# Patient Record
Sex: Female | Born: 1965
Health system: Southern US, Community
[De-identification: ages and names within clinical notes are randomized; demographics above are authoritative.]

## PROBLEM LIST (undated history)

## (undated) DIAGNOSIS — B001 Herpesviral vesicular dermatitis: Secondary | ICD-10-CM

## (undated) DIAGNOSIS — T7840XA Allergy, unspecified, initial encounter: Secondary | ICD-10-CM

## (undated) DIAGNOSIS — Z5189 Encounter for other specified aftercare: Secondary | ICD-10-CM

## (undated) HISTORY — DX: Herpesviral vesicular dermatitis: B00.1

## (undated) HISTORY — PX: TUBAL LIGATION: SHX77

## (undated) HISTORY — PX: ABDOMINAL HYSTERECTOMY: SHX81

## (undated) HISTORY — PX: BREAST SURGERY: SHX581

## (undated) HISTORY — DX: Encounter for other specified aftercare: Z51.89

## (undated) HISTORY — DX: Allergy, unspecified, initial encounter: T78.40XA

## (undated) HISTORY — PX: COSMETIC SURGERY: SHX468

---
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
## 1998-11-23 ENCOUNTER — Ambulatory Visit (HOSPITAL_COMMUNITY): Admission: RE | Admit: 1998-11-23 | Discharge: 1998-11-23 | Payer: Self-pay | Admitting: Gastroenterology

## 1999-03-11 ENCOUNTER — Ambulatory Visit (HOSPITAL_COMMUNITY): Admission: RE | Admit: 1999-03-11 | Discharge: 1999-03-11 | Payer: Self-pay | Admitting: Gastroenterology

## 1999-03-14 ENCOUNTER — Emergency Department (HOSPITAL_COMMUNITY): Admission: EM | Admit: 1999-03-14 | Discharge: 1999-03-14 | Payer: Self-pay | Admitting: Emergency Medicine

## 1999-05-19 ENCOUNTER — Ambulatory Visit (HOSPITAL_COMMUNITY): Admission: RE | Admit: 1999-05-19 | Discharge: 1999-05-19 | Payer: Self-pay | Admitting: Gastroenterology

## 1999-05-19 ENCOUNTER — Encounter: Payer: Self-pay | Admitting: Gastroenterology

## 1999-05-25 ENCOUNTER — Ambulatory Visit (HOSPITAL_COMMUNITY): Admission: RE | Admit: 1999-05-25 | Discharge: 1999-05-25 | Payer: Self-pay | Admitting: Gastroenterology

## 1999-05-25 ENCOUNTER — Encounter: Payer: Self-pay | Admitting: Gastroenterology

## 1999-11-14 DIAGNOSIS — Z5189 Encounter for other specified aftercare: Secondary | ICD-10-CM

## 1999-11-14 HISTORY — DX: Encounter for other specified aftercare: Z51.89

## 2000-07-31 ENCOUNTER — Encounter (INDEPENDENT_AMBULATORY_CARE_PROVIDER_SITE_OTHER): Payer: Self-pay | Admitting: Specialist

## 2000-07-31 ENCOUNTER — Inpatient Hospital Stay (HOSPITAL_COMMUNITY): Admission: RE | Admit: 2000-07-31 | Discharge: 2000-08-04 | Payer: Self-pay | Admitting: Obstetrics and Gynecology

## 2009-10-06 ENCOUNTER — Emergency Department (HOSPITAL_COMMUNITY): Admission: EM | Admit: 2009-10-06 | Discharge: 2009-10-07 | Payer: Self-pay | Admitting: Emergency Medicine

## 2009-10-26 ENCOUNTER — Ambulatory Visit (HOSPITAL_COMMUNITY): Admission: RE | Admit: 2009-10-26 | Discharge: 2009-10-26 | Payer: Self-pay | Admitting: Gastroenterology

## 2010-12-05 IMAGING — US US ABDOMEN COMPLETE
1 series · 13 of 25 positions shown · non-contrast
Comparison: None

CLINICAL DATA: Abdominal pain.

ABDOMINAL ULTRASOUND COMPLETE

[Series 1: us abdomen complete · 0.32mm/px · 13 of 46 slices shown]
[im 1/46]
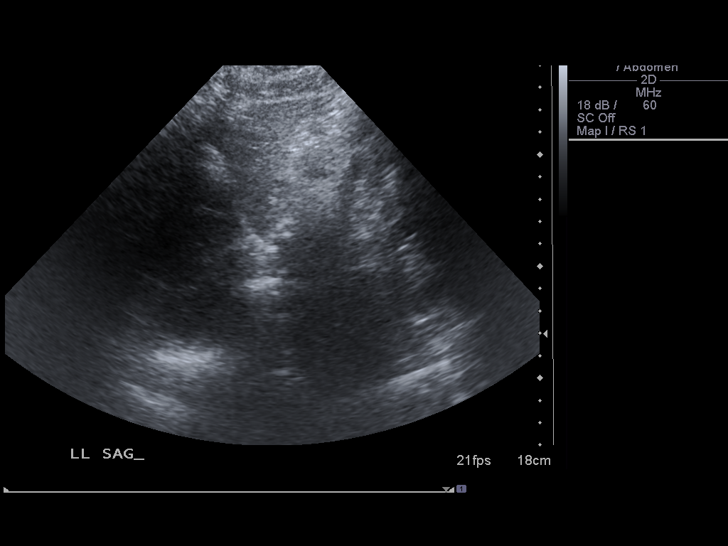
[im 4/46]
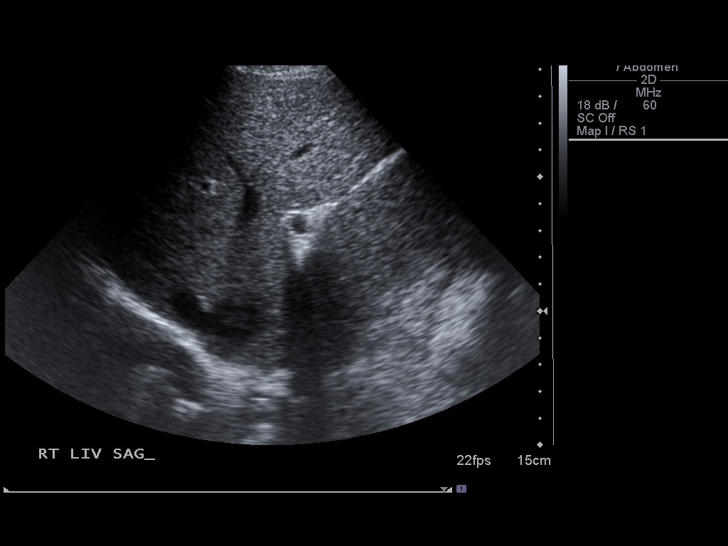
[im 8/46]
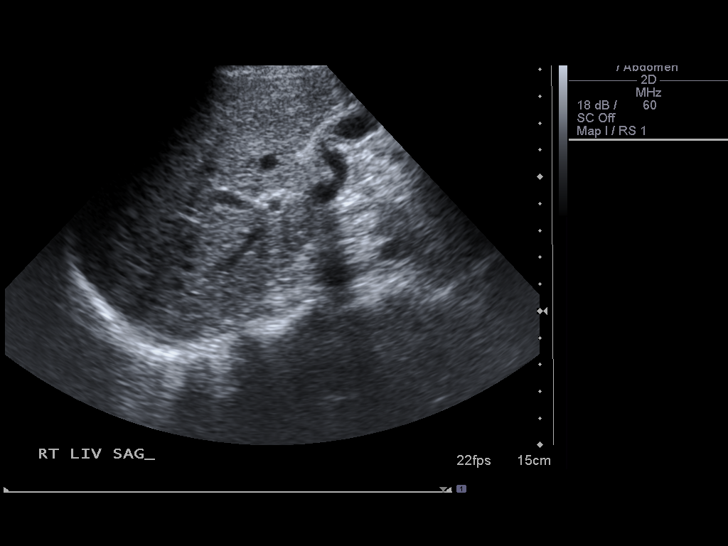
[im 12/46]
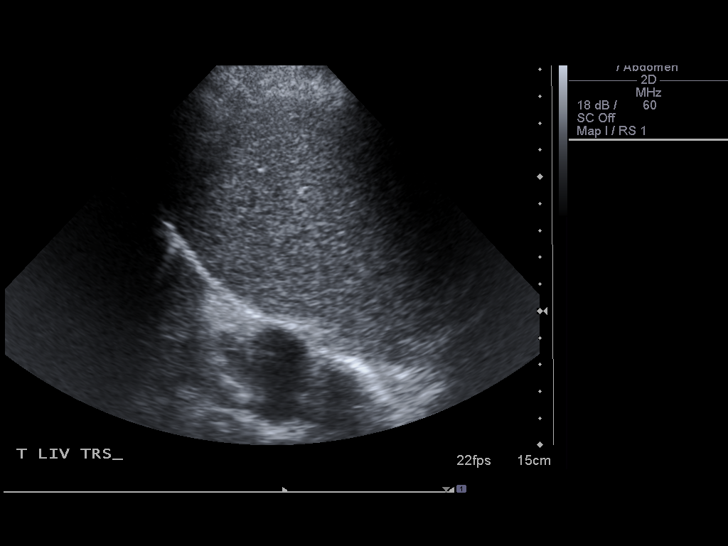
[im 16/46]
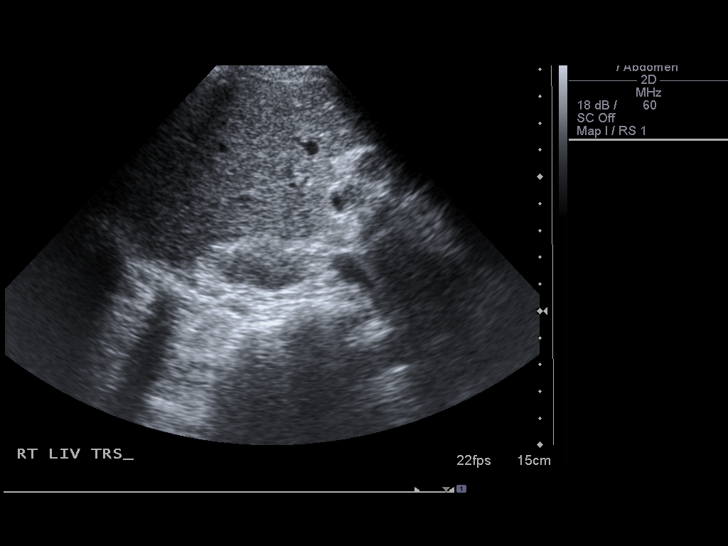
[im 19/46]
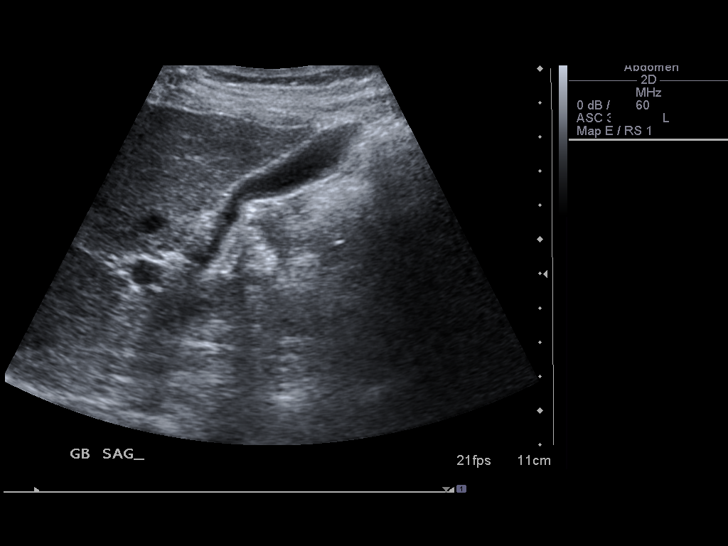
[im 23/46]
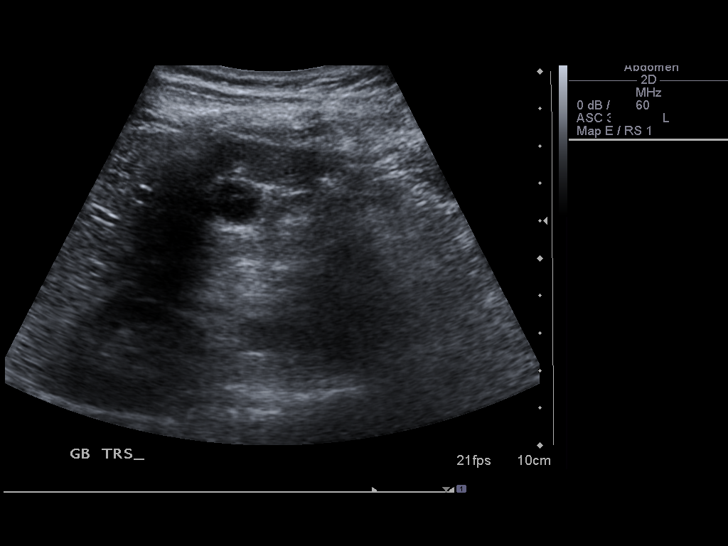
[im 27/46]
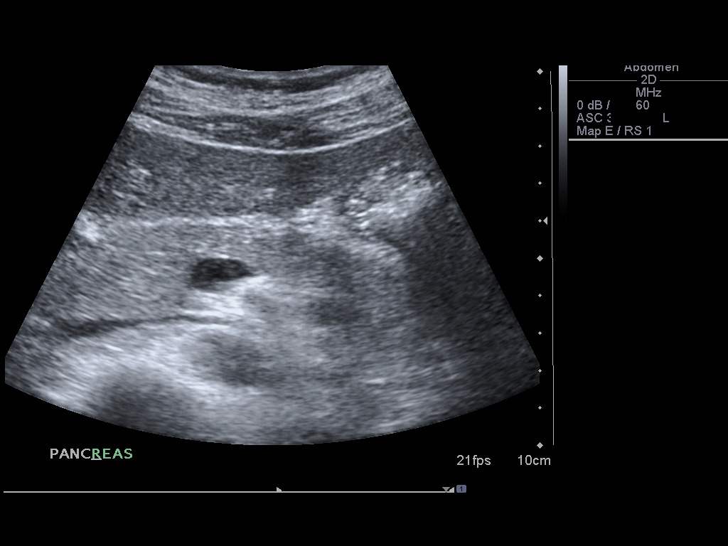
[im 31/46]
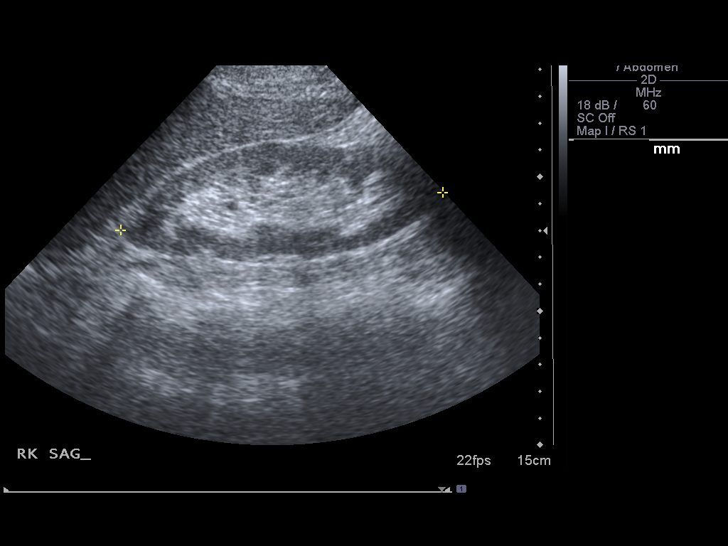
[im 34/46]
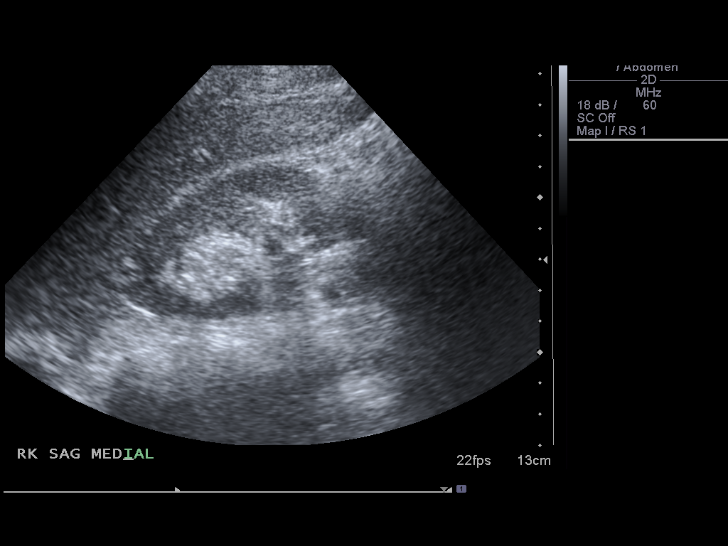
[im 38/46]
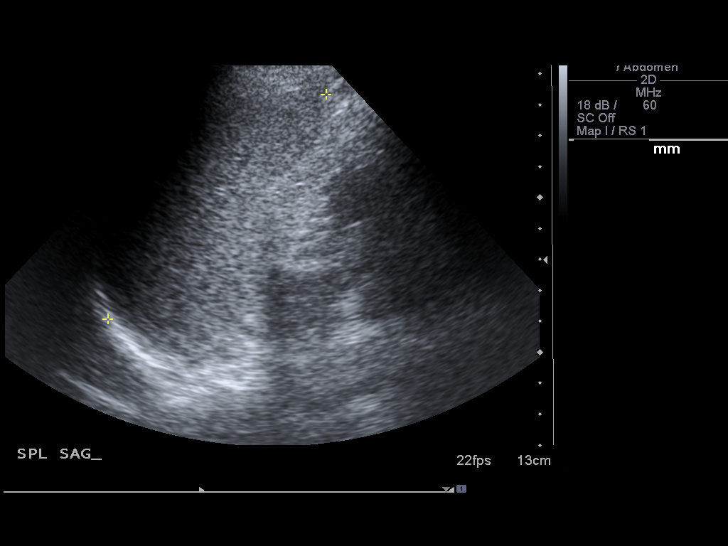
[im 42/46]
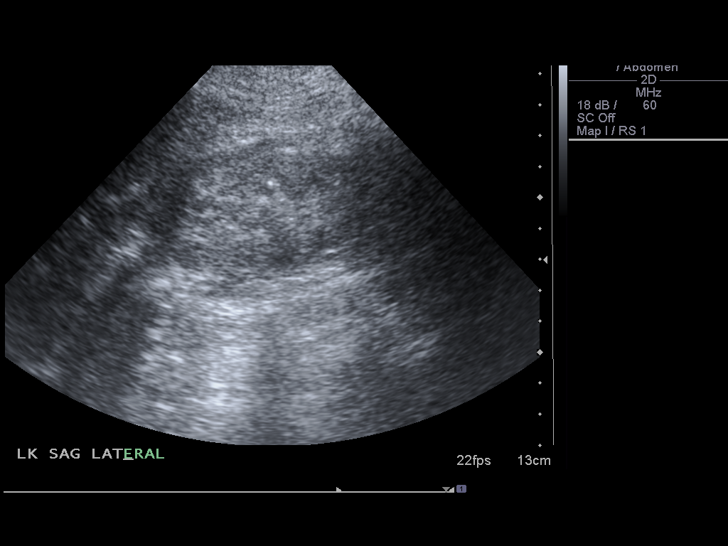
[im 46/46]
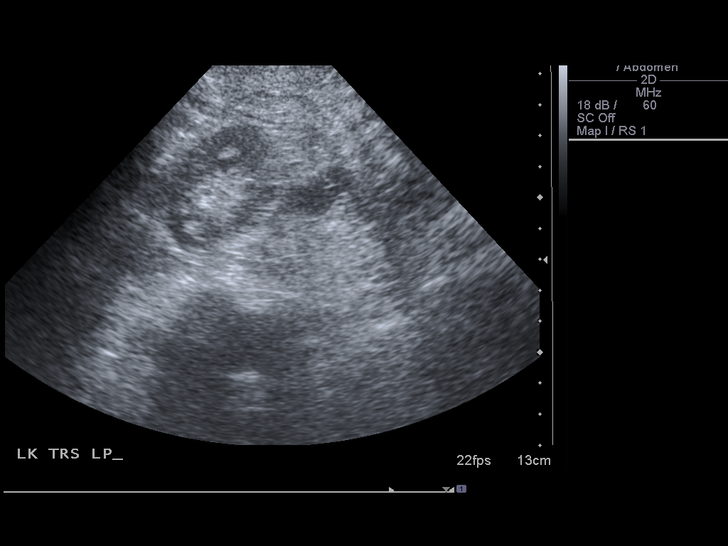

[13 of 25 positions shown; findings below may reference images not displayed]

FINDINGS: Gallbladder:    A 1.4 mm echogenic focus associated with the fundal
gallbladder wall does not cause posterior acoustic shadowing most
likely represents a gallbladder polyp, or less likely a
nonshadowing small gallstone.  Gallbladder is not distended.
Gallbladder wall thickness is normal (1.7 mm).  No pericholecystic
fluid.

Common Bile Duct:  Within normal limits in caliber.  4.5 mm.

Liver:  No focal lesion identified.  Within normal limits in
parenchymal echogenicity.

IVC:  Appears normal.

Pancreas:  Although the pancreas is difficult to visualize in its
entirety, no focal pancreatic abnormality is identified.

Spleen:  Within normal limits in size and echotexture.

Right kidney:  Normal in size and parenchymal echogenicity. Mild
thinning of the renal cortex. No evidence of mass or
hydronephrosis.

Left kidney:  Normal in size and parenchymal echogenicity. Mild
thinning of the renal cortex.  No evidence of mass or
hydronephrosis.

Abdominal Aorta:  No aneurysm identified.

No ascites is identified.
IMPRESSION: 1.  Probable 2.4 mm gallbladder polyp.  A nonshadowing small
gallstone is also a consideration.
2.  No evidence of acute cholecystitis or other acute finding
identified.

## 2011-02-15 LAB — DIFFERENTIAL
Basophils Relative: 1 % (ref 0–1)
Lymphocytes Relative: 26 % (ref 12–46)
Lymphs Abs: 1.6 10*3/uL (ref 0.7–4.0)
Monocytes Relative: 11 % (ref 3–12)
Neutro Abs: 3.7 10*3/uL (ref 1.7–7.7)
Neutrophils Relative %: 62 % (ref 43–77)

## 2011-02-15 LAB — CBC
HCT: 36.3 % (ref 36.0–46.0)
Hemoglobin: 12.1 g/dL (ref 12.0–15.0)
MCHC: 33.3 g/dL (ref 30.0–36.0)
MCV: 91.4 fL (ref 78.0–100.0)
Platelets: 170 10*3/uL (ref 150–400)
RBC: 3.98 MIL/uL (ref 3.87–5.11)
RDW: 13.6 % (ref 11.5–15.5)
WBC: 6 10*3/uL (ref 4.0–10.5)

## 2011-02-15 LAB — COMPREHENSIVE METABOLIC PANEL
BUN: 13 mg/dL (ref 6–23)
Calcium: 8.4 mg/dL (ref 8.4–10.5)
Creatinine, Ser: 0.76 mg/dL (ref 0.4–1.2)
Glucose, Bld: 91 mg/dL (ref 70–99)
Total Protein: 6.5 g/dL (ref 6.0–8.3)

## 2011-02-15 LAB — URINALYSIS, ROUTINE W REFLEX MICROSCOPIC
Bilirubin Urine: NEGATIVE
Glucose, UA: NEGATIVE mg/dL
Hgb urine dipstick: NEGATIVE
Ketones, ur: NEGATIVE mg/dL
Nitrite: NEGATIVE
Protein, ur: NEGATIVE mg/dL
Specific Gravity, Urine: 1.011 (ref 1.005–1.030)
Urobilinogen, UA: 1 mg/dL (ref 0.0–1.0)
pH: 7 (ref 5.0–8.0)

## 2011-03-31 NOTE — Discharge Summary (Signed)
[facility]  Patient:    Monique Stephens, Monique Stephens                  MRN: [id number] Adm. Date:  [id number] Disc. Date: [id number] Attending:  Lendon Colonel                           Discharge Summary  PREOPERATIVE DIAGNOSIS:  Persistent dysmenorrhea.  DISCHARGE DIAGNOSIS:  Persistent dysmenorrhea, plus postoperative hemorrhage.  OPERATION PERFORMED: 1. Vaginal hysterectomy. 2. Exploratory laparotomy for control of postoperative bleeding.  HISTORY OF PRESENT ILLNESS:  Ms. Monique Stephens is a 45 year old female who had attempted conservative management of severe dysmenorrhea had been to no avail and she had opted to proceeding with vaginal hysterectomy.  LABORATORY STUDIES:  Admission hemoglobin was 13.  Postoperative hemoglobins were stabilized at 9.2 with a white count of 5000.  Preoperative coagulation profile was normal.  HOSPITAL COURSE:  Patient was admitted to the hospital and underwent what appeared to be an uneventful hysterectomy.  About three hours postoperatively, she had a drop in blood pressure and color appeared to be pale, and hemoglobin was 7.  It was felt that, while there was no overt vaginal bleeding, that the patient probably had an intra-abdominal hemorrhage.  She was taken to the operating room, exploratory laparotomy performed, and suture of bleeders in the parametrium was accomplished.  Her postoperative course was uncomplicated. She received three units of blood and, on the day of discharge, was discharged with iron, Percocet for pain, and to return to the office in three days for incision check.  She was again apprised of the risk of blood transfusion which she had been preoperatively.  The plan for her is to have HIV and hepatitis profile done at three and six months.  CONDITION ON DISCHARGE:  Improved. DD:  08/09/00 TD:  08/09/00 Job: [id number]

## 2011-03-31 NOTE — Op Note (Signed)
[facility]  Patient:    Monique Stephens, Monique Stephens                  MRN: [id number] Proc. Date: 07/31/00 Adm. Date:  [id number] Attending:  Lendon Colonel                           Operative Report  PREOPERATIVE DIAGNOSIS:  Postop hemodynamic instability with hemoperitoneum.  POSTOPERATIVE DIAGNOSIS:  Postop hemodynamic instability with hemoperitoneum.  OPERATION PERFORMED:  Exploratory laparotomy, suture of pelvic bleeders.  DESCRIPTION OF PROCEDURE:  The patient was placed in the supine position, prepped and draped in the usual sterile fashion. The pelvic examination revealed no pelvic hematomas. There was no significant vaginal bleeding. The patient was postop vaginal hysterectomy. The abdomen was entered through a midline approach with hemostasis with the Bovie. Upon entering the abdomen, there was at least 1500 to 2000 cc of blood and clots in the peritoneal cavity, this was removed. The pelvis was visualized and there was diffuse oozing around the pelvic cuff and posteriorly. These bleeders were arrested with right angled clamps and sutures of #0 chromic suture. Hemostasis was rapidly obtained. We then copiously irrigated all blood from the abdominal cavity. We looked at the spleen and the liver and then we once again inspected the pelvis for bleeders. I inspected the ureters and both peristalsis were without dilatation. We then closed the parietoperitoneum with #0 PDS and the fascia was closed with figure-of-eight #0 Novofil. The subcutaneum was closed with 1 suture of #0 Vicryl and the skin was closed with clips and 3 nylon sutures. Hemostasis was secure, about 20 cc of Marcaine was instilled into the incision. Ms. Crueger tolerated this procedure well. During the surgery, she had received 3 units of blood. Preoperatively, she had been informed of the risk of the blood transfusion including hepatitis and AIDS. I informed her husband  postoperatively that she had received this and in the recovery period she will again be informed. DD:  07/31/00 TD:  08/02/00 Job: [id number]

## 2011-03-31 NOTE — Op Note (Signed)
[facility]  Patient:    Monique Stephens, Monique Stephens                  MRN: [id number] Proc. Date: 07/31/00 Adm. Date:  [id number] Attending:  Lendon Colonel                           Operative Report  PREOPERATIVE DIAGNOSIS:  Severe dysmenorrhea unresponsive to conservative therapy.  POSTOPERATIVE DIAGNOSIS:  Severe dysmenorrhea unresponsive to conservative therapy.  OPERATION:  Vaginal hysterectomy.  DESCRIPTION OF PROCEDURE:  The patient was placed in lithotomy position and prepped and draped in the usual fashion.  Cervix was grasped with a tenaculum and injected with 10 cc of 1% Xylocaine with epinephrine.  Posterior cul-de-sac was incised with a sharp dissection.  Uterosacrals were clamped and ligated on each side and identified with hemostats.  Anterior cul-de-sac was incised.  The cardinals were then clamped and ligated with 0 chromic suture. The anterior cul-de-sac was identified with sharp dissection and entered, and the uterine arteries, upper broad ligament were then clamped and ligated with 0 chromic suture.  Specimen was retroverted, and uteroovarian anastomosis and tube were clamped and ligated with free-tie of 0 chromic and a suture ligature of 0 chromic.  Hemostasis was secure.  Both ovaries were normal. There were a few adhesions in the cul-de-sac which were lysed.  The uterosacral ligaments were then very carefully plicated in the midline with interrupted sutures of 0 Vicryl, and the vagina was closed with figure-of-eight 0 Vicryl from the uterosacral more caudally, and the peritoneum was closed with a 2-0 PDS which was then used to close the remaining portion of the vagina in a vertical plane in continuous locking suture.  Hemostasis was secure; the bladder was drained of about 500 cc of clear urine.  The patient tolerated the procedure well and was sent to the recovery room.  Sponge and needle counts were verified to be correct. DD:   07/31/00 TD:  08/01/00 Job: [id number]

## 2011-03-31 NOTE — H&P (Signed)
[facility]  Patient:    Monique Stephens, Monique Stephens                        MRN: [id number] Adm. Date:  07/31/00 Attending:  Katherine Roan, M.D.                         History and Physical  HISTORY OF PRESENT ILLNESS:  Theone is a 45 year old gravida 2, para 2 female who is complaining of heavy painful periods.  We have attempted to control this with laparoscopy and LUNA; she has also been tried on other conservative measures but continues to have discomfort.  At the time of laparoscopy, she had minor adhesions in the cul-de-sac.  Because of the continued problem, she has opted for hysterectomy.  Her last period of July 26, 2000.  ALLERGIES:  She is allergic to ATROPINE and CAFFEINE.  PREVIOUS HISTORY:  She had a hernia at age 40, cesarean section in 1993, laparoscopy in 1997 and a diagnostic laparoscopy with LUNA procedure in February of 2000.  REVIEW OF SYSTEMS:  HEENT:  She wears glasses.  No decrease in visual or auditory acuity.  No frequent headaches or dizziness.  HEART:  No history of rheumatic fever.  No chest pain or shortness of breath.  She denies hypertension.  LUNGS:  No chronic cough or asthma.  No history of wheezing. GU:  No stress urinary incontinence or frequency of urination.  She has been treated for bacterial colitis in the past as well as possible irritable bowel syndrome.  MUSCLE, BONES AND JOINTS:  No history of arthritis or fractures.  FAMILY HISTORY:  She has two brothers living and well.  Maternal aunt has breast cancer and her mother has cancer as well.  Her father, paternal uncle, paternal grandfather and maternal grandfather all have heart disease; her maternal grandmother and maternal aunts with diabetes.  PHYSICAL EXAMINATION  VITAL SIGNS:  Weight of 111 pounds.  Blood pressure 120/70.  HEENT:  Unremarkable.  Oropharynx is not injected.  NECK:  Supple.  Carotid pulses are equal without bruits.  No  adenopathy appreciated.  Trachea is in the midline.  BREASTS:  No masses or tenderness.  LUNGS:  Clear.  HEART:  Normal sinus rhythm.  No murmurs.  ABDOMEN:  Soft and flat.  Liver, spleen and kidneys are not palpated.  No masses are noted.  Bowel sounds are normal.  PELVIC:  Vulva and vagina are normal.  Cervix without lesion.  Uterus is midplane, normal size and shape, appears to be mobile.  No adnexal masses are noted.  EXTREMITIES:  Good range of motion, equal pulses and reflexes.  IMPRESSION:  Continued pelvic pain and dysmenorrhea, unresponsive to conservative therapy.  PLAN:  Vaginal hysterectomy.  Risks and benefits detailed; informed consent has been given to patient. DD:  07/30/00 TD:  07/31/00 Job: [id number]

## 2012-03-22 ENCOUNTER — Ambulatory Visit: Payer: BC Managed Care – PPO

## 2012-03-22 ENCOUNTER — Ambulatory Visit (INDEPENDENT_AMBULATORY_CARE_PROVIDER_SITE_OTHER): Payer: BC Managed Care – PPO | Admitting: Family Medicine

## 2012-03-22 ENCOUNTER — Telehealth: Payer: Self-pay

## 2012-03-22 VITALS — BP 103/68 | HR 83 | Temp 98.6°F | Resp 16 | Ht 61.0 in | Wt 118.6 lb

## 2012-03-22 DIAGNOSIS — R1011 Right upper quadrant pain: Secondary | ICD-10-CM

## 2012-03-22 LAB — POCT URINALYSIS DIPSTICK
Bilirubin, UA: NEGATIVE
Blood, UA: NEGATIVE
Ketones, UA: NEGATIVE
Leukocytes, UA: NEGATIVE
Nitrite, UA: NEGATIVE
pH, UA: 6

## 2012-03-22 LAB — POCT CBC
Hemoglobin: 13.3 g/dL (ref 12.2–16.2)
Lymph, poc: 2.3 (ref 0.6–3.4)
MCH, POC: 29.1 pg (ref 27–31.2)
MCHC: 31.1 g/dL — AB (ref 31.8–35.4)
MID (cbc): 0.7 (ref 0–0.9)
MPV: 10.2 fL (ref 0–99.8)
POC MID %: 10.5 %M (ref 0–12)
WBC: 6.8 10*3/uL (ref 4.6–10.2)

## 2012-03-22 LAB — POCT UA - MICROSCOPIC ONLY
RBC, urine, microscopic: NEGATIVE
Yeast, UA: NEGATIVE

## 2012-03-22 MED ORDER — DICYCLOMINE HCL 10 MG PO CAPS: 10.0000 mg | ORAL_CAPSULE | Freq: Three times a day (TID) | ORAL | Status: DC

## 2012-03-22 NOTE — Patient Instructions (Addendum)
Taking MiraLax daily for several days until bowels are on the loose side. The in use only as needed. I have found that often the nonspecific abdominal pain that we cannot find a diagnosis for is related to a backing up of stool in the colon, and if you get yourself cleaned out a little bit will take care of it.  Ultrasound of abdomen.  Take the Bentyl as needed for abdominal pain.  Advise doing a return visit with Dr. Elnoria Howard sometime soon. If worse at any time return here for reassessment also.

## 2012-03-22 NOTE — Telephone Encounter (Signed)
Patient had onset of pain today at noon while eating a [employer] . This is the same type of pain she has had before and been evaluated by Dr. Elnoria Howard. I advised the patient to come over for CBC urine and evaluation.

## 2012-03-22 NOTE — Telephone Encounter (Signed)
Patient  has been having abdominal pain and Dr. Elnoria Howard is on vacation.  She is in extreme pain and pain pills do not help her.  Would like to know what else she could do?  Wanting to know if something could be done while pain is in progress now.

## 2012-03-22 NOTE — Telephone Encounter (Signed)
Dr Cleta Alberts: Patient states that she is having abdominal pain again. Dr. Elnoria Howard is on vacation so she is unable to reach him. She is in pain and keeps going around in circles with getting it under control. At one point, we were thinking it was a gall bladder issue, but that has not been determined. Please call pt. If before 6pm today- [phone]. After 6, [phone].

## 2012-03-22 NOTE — Progress Notes (Signed)
Subjective: 46 year old patient who is who has had abdominal pain off and on for the past several years. She has been seeing better job and evaluated with a number of studies, as well as seeing Dr. Elnoria Howard. She's been getting these pains a couple times a year. They thought she should have gallbladder disease, but has never had it.  She was fine this morning. She took her lunch break and went to [employer] for lunch. She had a milkshake and lunch. While eating she developed acute right upper quadrant abdominal pain. It was a squeezing-like pain. No diaphoresis, nausea, or vomiting. Bowels move normally yester last yesterday which is normal for her. He has continued to hurt. However in the office during a 2 hour wait the pain has subsided considerably.  Objective: Pleasant alert oriented lady in no acute distress just sitting still. No CVA tenderness. Normal bowel sounds. Abdomen was soft without organomegaly or masses. She is diffusely tender, but more in the right upper quadrant. Some in the left upper quadrant and right lower quadrant. Chest clear. Heart rrr, no M Assessment: Right upper quadrant abdominal pain in a patient with twice annual recurrences of this.  Plan: Check CBC abdominal x-ray urinalysis cMET amylase and lipase  [facility] reading (PRIMARY) by  Dr. Alwyn Ren Large left gas bubble.  No other acute findings Results for orders placed in visit on 03/22/12  POCT CBC      Component Value Range   WBC 6.8  4.6 - 10.2 (K/uL)   Lymph, poc 2.3  0.6 - 3.4    POC LYMPH PERCENT 34.3  10 - 50 (%L)   MID (cbc) 0.7  0 - 0.9    POC MID % 10.5  0 - 12 (%M)   POC Granulocyte 3.8  2 - 6.9    Granulocyte percent 55.2  37 - 80 (%G)   RBC 4.57  4.04 - 5.48 (M/uL)   Hemoglobin 13.3  12.2 - 16.2 (g/dL)   HCT, POC 16.1  09.6 - 47.9 (%)   MCV 93.4  80 - 97 (fL)   MCH, POC 29.1  27 - 31.2 (pg)   MCHC 31.1 (*) 31.8 - 35.4 (g/dL)   RDW, POC 04.5     Platelet Count, POC 268  142 - 424 (K/uL)   MPV 10.2  0 -  99.8 (fL)  POCT URINALYSIS DIPSTICK      Component Value Range   Color, UA yellow     Clarity, UA clear     Glucose, UA neg     Bilirubin, UA neg     Ketones, UA neg     Spec Grav, UA >=1.030     Blood, UA neg     pH, UA 6.0     Protein, UA neg     Urobilinogen, UA 0.2     Nitrite, UA neg     Leukocytes, UA Negative    POCT UA - MICROSCOPIC ONLY      Component Value Range   WBC, Ur, HPF, POC 0-1     RBC, urine, microscopic neg     Bacteria, U Microscopic trace     Mucus, UA neg     Epithelial cells, urine per micros 0-2     Crystals, Ur, HPF, POC neg     Casts, Ur, LPF, POC neg     Yeast, UA neg

## 2012-03-23 LAB — COMPREHENSIVE METABOLIC PANEL
ALT: 19 U/L (ref 0–35)
Albumin: 4.4 g/dL (ref 3.5–5.2)
CO2: 27 mEq/L (ref 19–32)
Calcium: 10.1 mg/dL (ref 8.4–10.5)
Chloride: 104 mEq/L (ref 96–112)
Glucose, Bld: 87 mg/dL (ref 70–99)
Potassium: 4.5 mEq/L (ref 3.5–5.3)
Sodium: 139 mEq/L (ref 135–145)
Total Protein: 7.1 g/dL (ref 6.0–8.3)

## 2012-03-23 LAB — AMYLASE: Amylase: 67 U/L (ref 0–105)

## 2012-03-23 LAB — LIPASE: Lipase: 47 U/L (ref 0–75)

## 2012-03-25 ENCOUNTER — Encounter: Payer: Self-pay | Admitting: Family Medicine

## 2012-03-29 ENCOUNTER — Other Ambulatory Visit: Payer: Self-pay

## 2012-05-18 ENCOUNTER — Ambulatory Visit (INDEPENDENT_AMBULATORY_CARE_PROVIDER_SITE_OTHER): Payer: BC Managed Care – PPO | Admitting: Family Medicine

## 2012-05-18 ENCOUNTER — Ambulatory Visit: Payer: BC Managed Care – PPO

## 2012-05-18 VITALS — BP 98/58 | HR 91 | Temp 99.2°F | Resp 16 | Ht 61.25 in | Wt 114.8 lb

## 2012-05-18 DIAGNOSIS — R109 Unspecified abdominal pain: Secondary | ICD-10-CM

## 2012-05-18 DIAGNOSIS — N3289 Other specified disorders of bladder: Secondary | ICD-10-CM

## 2012-05-18 DIAGNOSIS — N200 Calculus of kidney: Secondary | ICD-10-CM

## 2012-05-18 LAB — POCT URINALYSIS DIPSTICK
Nitrite, UA: NEGATIVE
Protein, UA: 30
Spec Grav, UA: >=1.03
Urobilinogen, UA: 0.2

## 2012-05-18 LAB — POCT UA - MICROSCOPIC ONLY
Casts, Ur, LPF, POC: NEGATIVE
Crystals, Ur, HPF, POC: NEGATIVE
Yeast, UA: NEGATIVE

## 2012-05-18 NOTE — Progress Notes (Signed)
46 yo woman with right flank pain, nausea this morning as of 7 am.  Pain intensified as the morning progressed.  Developed dry heaves with some abdominal pain from retching. Nausea increased with movement.  She took some ibuprofen. Current pain:  None No h/o kidney stones.  Objective:  NAD *RADIOLOGY REPORT*  Clinical Data: Abdominal pain  ABDOMEN - 2 VIEW  Comparison: None  Findings: The heart size and mediastinal contours are within normal  limits. Both lungs are clear. The visualized skeletal structures  are unremarkable.  Bowel gas pattern appears non-obstructed. There is a small right  renal stone which measures approximately 3 mm.  IMPRESSION:  1. No active cardiopulmonary abnormalities.  2. Nonobstructive bowel gas pattern.  3. Right renal calculus.   Results for orders placed in visit on 05/18/12  POCT UA - MICROSCOPIC ONLY      Component Value Range   WBC, Ur, HPF, POC 2-4     RBC, urine, microscopic 40-50     Bacteria, U Microscopic 1+     Mucus, UA 2+     Epithelial cells, urine per micros 3-6     Crystals, Ur, HPF, POC neg     Casts, Ur, LPF, POC neg     Yeast, UA neg    POCT URINALYSIS DIPSTICK      Component Value Range   Color, UA drk yellow     Clarity, UA cloudy     Glucose, UA neg     Bilirubin, UA small     Ketones, UA 15     Spec Grav, UA >=1.030     Blood, UA large     pH, UA 6.0     Protein, UA 30     Urobilinogen, UA 0.2     Nitrite, UA neg     Leukocytes, UA Negative    [facility] reading (PRIMARY) by  Dr. Milus Glazier:  No stone seen.   Assessment:  Right  Kidney stone, passed  Plan 1. Flank pain  Urine culture, DG Abd 1 View, Stone analysis  2. Bladder spasms  POCT UA - Microscopic Only, POCT urinalysis dipstick  3. Kidney stone  POCT UA - Microscopic Only, POCT urinalysis dipstick, Urine culture, Stone analysis

## 2012-05-18 NOTE — Patient Instructions (Addendum)

## 2012-05-19 LAB — URINE CULTURE
Colony Count: NO GROWTH
Organism ID, Bacteria: NO GROWTH

## 2012-05-23 LAB — STONE ANALYSIS: Stone Weight KSTONE: 0.009 g

## 2012-07-30 ENCOUNTER — Ambulatory Visit (INDEPENDENT_AMBULATORY_CARE_PROVIDER_SITE_OTHER): Payer: BC Managed Care – PPO | Admitting: Emergency Medicine

## 2012-07-30 ENCOUNTER — Encounter: Payer: Self-pay | Admitting: Emergency Medicine

## 2012-07-30 VITALS — BP 110/78 | HR 81 | Temp 98.4°F | Resp 16 | Ht 61.0 in | Wt 114.0 lb

## 2012-07-30 DIAGNOSIS — Z0000 Encounter for general adult medical examination without abnormal findings: Secondary | ICD-10-CM

## 2012-07-30 DIAGNOSIS — N200 Calculus of kidney: Secondary | ICD-10-CM

## 2012-07-30 DIAGNOSIS — R1011 Right upper quadrant pain: Secondary | ICD-10-CM

## 2012-07-30 LAB — CBC WITH DIFFERENTIAL/PLATELET
Basophils Relative: 1 % (ref 0–1)
Eosinophils Relative: 1 % (ref 0–5)
HCT: 40.2 % (ref 36.0–46.0)
Hemoglobin: 13.9 g/dL (ref 12.0–15.0)
Lymphocytes Relative: 13 % (ref 12–46)
MCHC: 34.6 g/dL (ref 30.0–36.0)
MCV: 89.3 fL (ref 78.0–100.0)
Monocytes Absolute: 0.7 10*3/uL (ref 0.1–1.0)
Monocytes Relative: 8 % (ref 3–12)
Neutro Abs: 6.9 10*3/uL (ref 1.7–7.7)

## 2012-07-30 LAB — LIPID PANEL
Cholesterol: 154 mg/dL (ref 0–200)
Triglycerides: 43 mg/dL (ref <150)

## 2012-07-30 LAB — POCT UA - MICROSCOPIC ONLY
Bacteria, U Microscopic: NEGATIVE
Crystals, Ur, HPF, POC: NEGATIVE
Mucus, UA: NEGATIVE

## 2012-07-30 LAB — POCT URINALYSIS DIPSTICK
Ketones, UA: NEGATIVE
Protein, UA: NEGATIVE
Spec Grav, UA: 1.02
pH, UA: 5.5

## 2012-07-30 LAB — COMPREHENSIVE METABOLIC PANEL
Albumin: 4.4 g/dL (ref 3.5–5.2)
BUN: 21 mg/dL (ref 6–23)
Calcium: 9.4 mg/dL (ref 8.4–10.5)
Chloride: 107 mEq/L (ref 96–112)
Glucose, Bld: 85 mg/dL (ref 70–99)
Potassium: 4.3 mEq/L (ref 3.5–5.3)

## 2012-07-30 LAB — TSH: TSH: 1.557 u[IU]/mL (ref 0.350–4.500)

## 2012-07-30 NOTE — Progress Notes (Signed)
@[email or web address] @  Patient ID: Monique Stephens MRN: [id number], DOB: July 22, 1966, 46 y.o. Date of Encounter: 07/30/2012, 8:23 AM  Primary Physician: Tally Due, MD  Chief Complaint: Physical (CPE)  HPI: 46 y.o. y/o female with history of noted below here for CPE.  Doing well. No issues/complaints.  LMP:  Pap: MMG: Review of Systems:  Consitutional: No fever, chills, fatigue, night sweats, lymphadenopathy, or weight changes. Eyes: No visual changes, eye redness, or discharge. ENT/Mouth: Ears: No otalgia, tinnitus, hearing loss, discharge. Nose: No congestion, rhinorrhea, sinus pain, or epistaxis. Throat: No sore throat, post nasal drip, or teeth pain. Cardiovascular: No CP, palpitations, diaphoresis, DOE, edema, orthopnea, PND. Respiratory: She has a history of exercise-induced asthma she has Dulera to take but rarely takes it. As a pro-air inhaler to use as  Gastrointestinal: No anorexia, dysphagia, reflux, pain, nausea, vomiting, hematemesis, diarrhea, constipation, BRBPR, or melena. Breast: No discharge, pain, swelling, or mass. Genitourinary: No dysuria, frequency, urgency, hematuria, incontinence, nocturia, amenorrhea, vaginal discharge, pruritis, burning, abnormal bleeding, or pain. Musculoskeletal: No decreased ROM, myalgias, stiffness, joint swelling, or weakness. Skin: No rash, erythema, lesion changes, pain, warmth, jaundice, or pruritis. Neurological: No headache, dizziness, syncope, seizures, tremors, memory loss, coordination problems, or paresthesias. Psychological: No anxiety, depression, hallucinations, SI/HI. Endocrine: No fatigue, polydipsia, polyphagia, polyuria, or known diabetes. All other systems were reviewed and are otherwise negative.  No past medical history on file.   No past surgical history on file.  Home Meds:  Prior to Admission medications   Medication Sig Start Date End Date Taking? Authorizing Provider  ibuprofen (ADVIL,MOTRIN) 200 MG tablet  Take 200 mg by mouth every 6 (six) hours as needed.   Yes Historical Provider, MD  loratadine (CLARITIN) 10 MG tablet Take 10 mg by mouth daily.   Yes Historical Provider, MD  valACYclovir (VALTREX) 500 MG tablet Take 500 mg by mouth 2 (two) times daily.   Yes Historical Provider, MD  dicyclomine (BENTYL) 10 MG capsule Take 1 capsule (10 mg total) by mouth 4 (four) times daily -  before meals and at bedtime. 03/22/12 03/22/13  Peyton Najjar, MD    Allergies:  Allergies  Allergen Reactions  . Atropine Hives  . Caffeine Other (See Comments)    Shakes; hot/cold flashes;dizziness    History   Social History  . Marital Status: Single    Spouse Name: N/A    Number of Children: N/A  . Years of Education: N/A   Occupational History  . Not on file.   Social History Main Topics  . Smoking status: Never Smoker   . Smokeless tobacco: Not on file  . Alcohol Use: Yes     occasionally  . Drug Use: No  . Sexually Active: Yes   Other Topics Concern  . Not on file   Social History Narrative  . No narrative on file    No family history on file.  Physical Exam  Blood pressure 110/78, pulse 81, temperature 98.4 F (36.9 C), temperature source Oral, resp. rate 16, height 5\' 1"  (1.549 m), weight 114 lb (51.71 kg), SpO2 98.00%., Body mass index is 21.54 kg/(m^2). General: Well developed, well nourished, in no acute distress. HEENT: Normocephalic, atraumatic. Conjunctiva pink, sclera non-icteric. Pupils 2 mm constricting to 1 mm, round, regular, and equally reactive to light and accomodation. EOMI. Internal auditory canal clear. TMs with good cone of light and without pathology. Nasal mucosa pink. Nares are without discharge. No sinus tenderness. Oral mucosa pink. Dentition normal . Pharynx without  exudate.   Neck: Supple. Trachea midline. No thyromegaly. Full ROM. No lymphadenopathy. Lungs: Clear to auscultation bilaterally without wheezes, rales, or rhonchi. Breathing is of normal effort  and unlabored. Cardiovascular: RRR with S1 S2. No murmurs, rubs, or gallops appreciated. Distal pulses 2+ symmetrically. No carotid or abdominal bruits.  Breast: No breast masses palpable there are no axillary masses palpable  Abdomen: Soft, non-tender, non-distended with normoactive bowel sounds. No hepatosplenomegaly or masses. No rebound/guarding. No CVA tenderness. Without hernias.  Genitourinary: Deferred patient sees Dr. Ambrose Mantle    Musculoskeletal: Full range of motion and 5/5 strength throughout. Without swelling, atrophy, tenderness, crepitus, or warmth. Extremities without clubbing, cyanosis, or edema. Calves supple. Skin: Warm and moist without erythema, ecchymosis, wounds, or rash. Neuro: A+Ox3. CN II-XII grossly intact. Moves all extremities spontaneously. Full sensation throughout. Normal gait. DTR 2+ throughout upper and lower extremities. Finger to nose intact. Psych:  Responds to questions appropriately with a normal affect.        Assessment/Plan:  46 y.o. y/o female here for CPE. She does have a family history of cancers. Her physical exam today is completely normal except for she is an avid tanner and advised her about the risks of sun exposure. Routine labs were done today  -  Signed, Earl Lites, MD 07/30/2012 8:23 AM

## 2012-07-30 NOTE — Patient Instructions (Addendum)
Please check your skin regularly because of your history of sun exposure.

## 2012-08-01 ENCOUNTER — Encounter: Payer: Self-pay | Admitting: Radiology

## 2012-08-01 ENCOUNTER — Telehealth: Payer: Self-pay

## 2012-08-01 NOTE — Telephone Encounter (Signed)
PATIENT WOULD LIKE TO PICK UP LAB RECORDS FROM [phone] TO SEND TO THE INSURANCE. SHE WILL COME IN TO SIGN A RELEASE AND PICK THEM UP FROM HER AFTER WORK IF WE CAN HAVE THEM READY FOR HER.

## 2012-08-01 NOTE — Telephone Encounter (Signed)
Labs printed for pick up.

## 2012-09-11 ENCOUNTER — Other Ambulatory Visit: Payer: Self-pay | Admitting: Emergency Medicine

## 2012-09-11 NOTE — Telephone Encounter (Signed)
Pt would like a refill on claritin and valtrex.  She just had a cpe last month  [facility].  Pt going out of town today

## 2012-11-20 ENCOUNTER — Ambulatory Visit (INDEPENDENT_AMBULATORY_CARE_PROVIDER_SITE_OTHER): Payer: BC Managed Care – PPO | Admitting: Physician Assistant

## 2012-11-20 VITALS — BP 135/87 | HR 90 | Temp 98.6°F | Resp 16 | Ht 61.0 in | Wt 106.0 lb

## 2012-11-20 DIAGNOSIS — R05 Cough: Secondary | ICD-10-CM

## 2012-11-20 DIAGNOSIS — J069 Acute upper respiratory infection, unspecified: Secondary | ICD-10-CM

## 2012-11-20 DIAGNOSIS — B001 Herpesviral vesicular dermatitis: Secondary | ICD-10-CM | POA: Insufficient documentation

## 2012-11-20 DIAGNOSIS — R059 Cough, unspecified: Secondary | ICD-10-CM

## 2012-11-20 MED ORDER — IPRATROPIUM BROMIDE 0.03 % NA SOLN: 2.0000 | Freq: Two times a day (BID) | NASAL | Status: DC

## 2012-11-20 MED ORDER — GUAIFENESIN ER 1200 MG PO TB12: 1.0000 | ORAL_TABLET | Freq: Two times a day (BID) | ORAL | Status: DC | PRN

## 2012-11-20 MED ORDER — BENZONATATE 100 MG PO CAPS: 100.0000 mg | ORAL_CAPSULE | Freq: Three times a day (TID) | ORAL | Status: DC | PRN

## 2012-11-20 MED ORDER — AMOXICILLIN-POT CLAVULANATE 875-125 MG PO TABS: 1.0000 | ORAL_TABLET | Freq: Two times a day (BID) | ORAL | Status: DC

## 2012-11-20 NOTE — Progress Notes (Signed)
Subjective:    Patient ID: Monique Stephens, female    DOB: August 11, 1966, 47 y.o.   MRN: [id number]  HPI This 47 y.o. female presents for evaluation of illness. Symptoms began on Friday.  Boyfriend developed symptoms one day prior.  He was here yesterday and diagnosed with "walking pneumonia due to the flu."  Felt cold, icky, shivery.  Mild cough.  [place].  No GI/GU.  No rash.  Initially, thought she was getting a sore thorat, but didn't.  Sinus pressure/congestion.  Felt worse overall yesterday.  Cough gets worse in the evenings.   Past Medical History  Diagnosis Date  . Allergy   . Fever blister     Past Surgical History  Procedure Date  . Tubal ligation   . Abdominal hysterectomy   . Cesarean section   . Hernia repair 1969    Prior to Admission medications   Medication Sig Start Date End Date Taking? Authorizing Provider  ibuprofen (ADVIL,MOTRIN) 200 MG tablet Take 200 mg by mouth every 6 (six) hours as needed.   Yes Historical Provider, MD  loratadine (CLARITIN) 10 MG tablet TAKE ONE TABLET BY MOUTH ONE TIME DAILY 09/11/12  Yes Heather M Marte, PA-C  valACYclovir (VALTREX) 1000 MG tablet TAKE 2 TABLETS BY MOUTH TWICE DAILY FOR 2 DOSES 09/11/12  Yes Heather M Marte, PA-C  amoxicillin-clavulanate (AUGMENTIN) 875-125 MG per tablet Take 1 tablet by mouth 2 (two) times daily. 11/20/12   Lova Urbieta S Jaben Benegas, PA-C  benzonatate (TESSALON) 100 MG capsule Take 1-2 capsules (100-200 mg total) by mouth 3 (three) times daily as needed for cough. 11/20/12   Tylee Yum S Ayva Veilleux, PA-C  dicyclomine (BENTYL) 10 MG capsule Take 1 capsule (10 mg total) by mouth 4 (four) times daily -  before meals and at bedtime. 03/22/12 03/22/13  Peyton Najjar, MD  Guaifenesin [facility] MAXIMUM STRENGTH) 1200 MG TB12 Take 1 tablet (1,200 mg total) by mouth every 12 (twelve) hours as needed. 11/20/12   Ravyn Nikkel S Pamlea Finder, PA-C  ipratropium (ATROVENT) 0.03 % nasal spray Place 2 sprays into the nose 2 (two) times daily. 11/20/12    Shanley Furlough Tessa Lerner, PA-C    Allergies  Allergen Reactions  . Atropine Hives  . Caffeine Other (See Comments)    Shakes; hot/cold flashes;dizziness    History   Social History  . Marital Status: Single    Spouse Name: n/a    Number of Children: 2  . Years of Education: 12   Occupational History  . Supervisor Dispensing optician   Social History Main Topics  . Smoking status: Never Smoker   . Smokeless tobacco: Never Used  . Alcohol Use: Yes     Comment: occasionally  . Drug Use: No  . Sexually Active: Yes -- Female partner(s)    Birth Control/ Protection: Surgical   Other Topics Concern  . Not on file   Social History Narrative   Lives in her own home, her boyfriend lives with her.  Her daughter lives with her intermittently.    Family History  Problem Relation Age of Onset  . Cancer Mother 59    Non-Hodgkin's Lymphoma  . Alzheimer's disease Father    Review of Systems As above    Objective:   Physical Exam  Blood pressure 135/87, pulse 90, temperature 98.6 F (37 C), temperature source Oral, resp. rate 16, height 5\' 1"  (1.549 m), weight 106 lb (48.081 kg), SpO2 100.00%. Body mass index is 20.03 kg/(m^2). Well-developed, well nourished WF who  is awake, alert and oriented, in NAD. HEENT: [place]/AT, PERRL, EOMI.  Sclera and conjunctiva are clear.  EAC are patent, TMs are normal in appearance. Nasal mucosa is pink and moist with mild congestion. OP is clear. No facial tenderness. Neck: supple, non-tender, no lymphadenopathy, thyromegaly. Heart: RRR, no murmur Lungs: normal effort, CTA Extremities: no cyanosis, clubbing or edema. Skin: warm and dry without rash. Psychologic: good mood and appropriate affect, normal speech and behavior.     Assessment & Plan:   1. Acute upper respiratory infections of unspecified site  ipratropium (ATROVENT) 0.03 % nasal spray, Guaifenesin (MUCINEX MAXIMUM STRENGTH) 1200 MG TB12, amoxicillin-clavulanate (AUGMENTIN) 875-125 MG  per tablet  2. Cough  benzonatate (TESSALON) 100 MG capsule   Patient Instructions  Get plenty of rest and drink at least 64 ounces of water daily. I encourage you to hold off on the antibiotic for 48 hours, as you may not need it.

## 2012-11-20 NOTE — Patient Instructions (Signed)
Get plenty of rest and drink at least 64 ounces of water daily. I encourage you to hold off on the antibiotic for 48 hours, as you may not need it.

## 2013-03-24 ENCOUNTER — Telehealth: Payer: Self-pay

## 2013-03-24 NOTE — Telephone Encounter (Signed)
PT HAS A QUESTION FOR ' DR. DAUB'S NURSE' REGARDING HER STOMACH ISSUES. BEFORE 5 PM [id number] AFTER 5 PM [phone]

## 2013-03-24 NOTE — Telephone Encounter (Signed)
Called her she is at lunch.

## 2013-03-25 ENCOUNTER — Other Ambulatory Visit: Payer: Self-pay | Admitting: Emergency Medicine

## 2013-03-25 DIAGNOSIS — R1011 Right upper quadrant pain: Secondary | ICD-10-CM

## 2013-03-25 NOTE — Telephone Encounter (Signed)
She needs to go back and see Dr. Elnoria Howard. She needs to discuss with him the possibility that these are gallbladder related and then get the opinion of a general surgeon as to what the likelihood of removing her gallbladder would be in alleviating these attacks .

## 2013-03-25 NOTE — Telephone Encounter (Signed)
Thanks, called her to advise she states she has gotten off the phone with Dr Cleta Alberts, and he told her, this is fine.

## 2013-03-25 NOTE — Telephone Encounter (Signed)
Called her, she states she is not sure what to do. She states she knows Dr Cleta Alberts will tell her to come back in. Dr Cleta Alberts has sent her to Dr Elnoria Howard for occasional GI issues, has "attacks" with GI upset , now she is afraid to eat anything, her pain/ and GI attacks are becoming more frequent. She states she is having nausea/ cramps. She states she had diarrhea and bloating also. She last saw Dr Elnoria Howard about a year ago. She wants suggestions about what to try, she has a physical scheduled for next month. She states she does not feel like this is related to her diet. She does know fatty foods will cause pain. However, now every thing she eats causes her to have pain. She denies weight loss. Any suggesitons?

## 2013-04-02 NOTE — Progress Notes (Signed)
Please call patient asked her she followed up with Dr. Elnoria Howard

## 2013-04-10 ENCOUNTER — Ambulatory Visit
Admission: RE | Admit: 2013-04-10 | Discharge: 2013-04-10 | Disposition: A | Discharge status: Home / Self Care | Payer: BC Managed Care – PPO | Source: Ambulatory Visit | Attending: Emergency Medicine | Admitting: Emergency Medicine

## 2013-04-10 ENCOUNTER — Telehealth: Payer: Self-pay | Admitting: Emergency Medicine

## 2013-04-10 ENCOUNTER — Other Ambulatory Visit: Payer: Self-pay

## 2013-04-10 DIAGNOSIS — R1011 Right upper quadrant pain: Secondary | ICD-10-CM

## 2013-04-10 NOTE — Telephone Encounter (Signed)
Please call patient at work and asked her if she followed up with Dr. Elnoria Howard.

## 2013-04-10 NOTE — Telephone Encounter (Signed)
Called her , and she did go for the [place] today and has appt with Dr Elnoria Howard on Monday, her pain is not as severe, still has cramping.

## 2013-04-17 ENCOUNTER — Telehealth: Payer: Self-pay

## 2013-04-17 NOTE — Telephone Encounter (Signed)
I have been trying to reach her about the [place] called her again. She was with customer, will try again later

## 2013-04-17 NOTE — Telephone Encounter (Signed)
Patient is returning a phone call. [phone]

## 2013-04-22 NOTE — Telephone Encounter (Signed)
Mailed results for U S to her, unable to reach.

## 2013-05-01 ENCOUNTER — Telehealth: Payer: Self-pay

## 2013-05-01 NOTE — Telephone Encounter (Signed)
Thanks left message for her to advise.

## 2013-05-01 NOTE — Telephone Encounter (Signed)
Celiac? Testing to be done.

## 2013-05-01 NOTE — Telephone Encounter (Signed)
Patient states she is coming in for a CPE on Tuesday with Dr. Cleta Alberts. States that Dr. Elnoria Howard would like her to be tested for cylia???? When patient has blood drawn. [phone] or [phone]

## 2013-05-01 NOTE — Telephone Encounter (Signed)
Just call patient and tell her to remind me she needs testing for celiac disease when I see her on Tuesday .

## 2013-05-06 ENCOUNTER — Ambulatory Visit (INDEPENDENT_AMBULATORY_CARE_PROVIDER_SITE_OTHER): Payer: BC Managed Care – PPO | Admitting: Emergency Medicine

## 2013-05-06 ENCOUNTER — Encounter: Payer: Self-pay | Admitting: Emergency Medicine

## 2013-05-06 VITALS — BP 98/64 | HR 77 | Temp 99.3°F | Resp 16 | Ht 61.0 in | Wt 109.6 lb

## 2013-05-06 DIAGNOSIS — Z0000 Encounter for general adult medical examination without abnormal findings: Secondary | ICD-10-CM

## 2013-05-06 DIAGNOSIS — R109 Unspecified abdominal pain: Secondary | ICD-10-CM

## 2013-05-06 DIAGNOSIS — R197 Diarrhea, unspecified: Secondary | ICD-10-CM

## 2013-05-06 LAB — CBC WITH DIFFERENTIAL/PLATELET
Basophils Relative: 1 % (ref 0–1)
HCT: 42.5 % (ref 36.0–46.0)
Hemoglobin: 14.4 g/dL (ref 12.0–15.0)
Lymphocytes Relative: 26 % (ref 12–46)
Lymphs Abs: 1.5 10*3/uL (ref 0.7–4.0)
MCHC: 33.9 g/dL (ref 30.0–36.0)
Monocytes Absolute: 0.4 10*3/uL (ref 0.1–1.0)
Monocytes Relative: 7 % (ref 3–12)
Neutro Abs: 3.7 10*3/uL (ref 1.7–7.7)

## 2013-05-06 LAB — POCT URINALYSIS DIPSTICK
Bilirubin, UA: NEGATIVE
Ketones, UA: NEGATIVE
Leukocytes, UA: NEGATIVE
pH, UA: 8.5

## 2013-05-06 NOTE — Progress Notes (Signed)
@[email or web address] @  Patient ID: Monique Stephens MRN: [id number], DOB: 07-05-1966, 47 y.o. Date of Encounter: 05/06/2013, 6:05 PM  Primary Physician: Tally Due, MD  Chief Complaint: Physical (CPE)  HPI: 47 y.o. y/o female with history of noted below here for CPE.  Doing well. No issues/complaints.  LMP:  Pap: MMG: Review of Systems:  Consitutional: No fever, chills, fatigue, night sweats, lymphadenopathy, or weight changes. Eyes: No visual changes, eye redness, or discharge. ENT/Mouth: Ears: No otalgia, tinnitus, hearing loss, discharge. Nose: No congestion, rhinorrhea, sinus pain, or epistaxis. Throat: No sore throat, post nasal drip, or teeth pain. Cardiovascular: No CP, palpitations, diaphoresis, DOE, edema, orthopnea, PND. Respiratory: She has minimal shortness of breath wheezing or respiratory problems  Gastrointestinal she is under the care of Dr. Elnoria Howard: Problems with abdominal pain cramping and loose stools. She just had a ultrasound done which showed a 4 mm gallbladder polyp but was otherwise unremarkable. She has a followup visit with him next week.  Breast: No discharge, pain, swelling, or mass. Genitourinary: No dysuria, frequency, urgency, hematuria, incontinence, nocturia, amenorrhea, vaginal discharge, pruritis, burning, abnormal bleeding, or pain. Musculoskeletal: No decreased ROM, myalgias, stiffness, joint swelling, or weakness. Skin: No rash, erythema, lesion changes, pain, warmth, jaundice, or pruritis. Neurological: No headache, dizziness, syncope, seizures, tremors, memory loss, coordination problems, or paresthesias. Psychological: No anxiety, depression, hallucinations, SI/HI. Endocrine: No fatigue, polydipsia, polyphagia, polyuria, or known diabetes. All other systems were reviewed and are otherwise negative.  Past Medical History  Diagnosis Date  . Allergy   . Fever blister      Past Surgical History  Procedure Laterality Date  . Tubal ligation     . Abdominal hysterectomy    . Cesarean section    . Hernia repair  1969    Home Meds:  Prior to Admission medications   Medication Sig Start Date End Date Taking? Authorizing Provider  ibuprofen (ADVIL,MOTRIN) 200 MG tablet Take 200 mg by mouth every 6 (six) hours as needed.   Yes Historical Provider, MD  loratadine (CLARITIN) 10 MG tablet TAKE ONE TABLET BY MOUTH ONE TIME DAILY 09/11/12  Yes Heather M Marte, PA-C  valACYclovir (VALTREX) 1000 MG tablet TAKE 2 TABLETS BY MOUTH TWICE DAILY FOR 2 DOSES 09/11/12  Yes Heather M Marte, PA-C  amoxicillin-clavulanate (AUGMENTIN) 875-125 MG per tablet Take 1 tablet by mouth 2 (two) times daily. 11/20/12   Chelle S Jeffery, PA-C  benzonatate (TESSALON) 100 MG capsule Take 1-2 capsules (100-200 mg total) by mouth 3 (three) times daily as needed for cough. 11/20/12   Chelle S Jeffery, PA-C  dicyclomine (BENTYL) 10 MG capsule Take 1 capsule (10 mg total) by mouth 4 (four) times daily -  before meals and at bedtime. 03/22/12 03/22/13  Peyton Najjar, MD  Guaifenesin [facility] MAXIMUM STRENGTH) 1200 MG TB12 Take 1 tablet (1,200 mg total) by mouth every 12 (twelve) hours as needed. 11/20/12   Chelle S Jeffery, PA-C  ipratropium (ATROVENT) 0.03 % nasal spray Place 2 sprays into the nose 2 (two) times daily. 11/20/12   Chelle Tessa Lerner, PA-C    Allergies:  Allergies  Allergen Reactions  . Atropine Hives  . Caffeine Other (See Comments)    Shakes; hot/cold flashes;dizziness    History   Social History  . Marital Status: Single    Spouse Name: n/a    Number of Children: 2  . Years of Education: 12   Occupational History  . Supervisor Dispensing optician   Social History  Main Topics  . Smoking status: Never Smoker   . Smokeless tobacco: Never Used  . Alcohol Use: Yes     Comment: occasionally  . Drug Use: No  . Sexually Active: Yes -- Female partner(s)    Birth Control/ Protection: Surgical   Other Topics Concern  . Not on file   Social  History Narrative   Lives in her own home, her boyfriend lives with her.  Her daughter lives with her intermittently.    Family History  Problem Relation Age of Onset  . Cancer Mother 63    Non-Hodgkin's Lymphoma  . Alzheimer's disease Father     Physical Exam  Blood pressure 98/64, pulse 77, temperature 99.3 F (37.4 C), temperature source Oral, resp. rate 16, height 5\' 1"  (1.549 m), weight 109 lb 9.6 oz (49.714 kg), SpO2 100.00%., Body mass index is 20.72 kg/(m^2). General: Well developed, well nourished, in no acute distress. HEENT: Normocephalic, atraumatic. Conjunctiva pink, sclera non-icteric. Pupils 2 mm constricting to 1 mm, round, regular, and equally reactive to light and accomodation. EOMI. Internal auditory canal clear. TMs with good cone of light and without pathology. Nasal mucosa pink. Nares are without discharge. No sinus tenderness. Oral mucosa pink. Dentition . Pharynx without exudate.   Neck: Supple. Trachea midline. No thyromegaly. Full ROM. No lymphadenopathy. Lungs: Clear to auscultation bilaterally without wheezes, rales, or rhonchi. Breathing is of normal effort and unlabored. Cardiovascular: RRR with S1 S2. No murmurs, rubs, or gallops appreciated. Distal pulses 2+ symmetrically. No carotid or abdominal bruits.  Breast: Deferred  Abdomen: Soft, non-tender, non-distended with normoactive bowel sounds. No hepatosplenomegaly or masses. No rebound/guarding. No CVA tenderness. Without hernias.  Genitourinary: Deferred    Musculoskeletal: Full range of motion and 5/5 strength throughout. Without swelling, atrophy, tenderness, crepitus, or warmth. Extremities without clubbing, cyanosis, or edema. Calves supple. Skin: Warm and moist without erythema, ecchymosis, wounds, or rash. Neuro: A+Ox3. CN II-XII grossly intact. Moves all extremities spontaneously. Full sensation throughout. Normal gait. DTR 2+ throughout upper and lower extremities. Finger to nose intact. Psych:   Responds to questions appropriately with a normal affect.        Assessment/Plan:  47 y.o. y/o female here for CPE. She sees Dr. Leveda Anna as her gynecologist. She is under the care of Dr. Elnoria Howard for her GI problems. I advised her to stop going to the tanning bed that she does frequently anyway. Routine labs were done today.  -  Signed, Earl Lites, MD 05/06/2013 6:05 PM

## 2013-05-07 LAB — LIPID PANEL
Cholesterol: 152 mg/dL (ref 0–200)
HDL: 49 mg/dL (ref >39)
Triglycerides: 54 mg/dL (ref <150)

## 2013-05-07 LAB — COMPREHENSIVE METABOLIC PANEL
Albumin: 3.9 g/dL (ref 3.5–5.2)
BUN: 15 mg/dL (ref 6–23)
CO2: 22 mEq/L (ref 19–32)
Calcium: 8.9 mg/dL (ref 8.4–10.5)
Chloride: 107 mEq/L (ref 96–112)
Glucose, Bld: 81 mg/dL (ref 70–99)
Potassium: 4.6 mEq/L (ref 3.5–5.3)

## 2013-05-08 LAB — CELIAC DISEASE PANEL
IgA/Immunoglobulin A, Serum: 173 mg/dL (ref 91–414)
Transglutaminase IgA: <2 U/mL (ref 0–3)

## 2013-05-26 ENCOUNTER — Encounter (INDEPENDENT_AMBULATORY_CARE_PROVIDER_SITE_OTHER): Payer: BC Managed Care – PPO | Admitting: Ophthalmology

## 2013-05-28 ENCOUNTER — Encounter (INDEPENDENT_AMBULATORY_CARE_PROVIDER_SITE_OTHER): Payer: BC Managed Care – PPO | Admitting: Ophthalmology

## 2013-05-28 DIAGNOSIS — H5210 Myopia, unspecified eye: Secondary | ICD-10-CM

## 2013-05-28 DIAGNOSIS — H43819 Vitreous degeneration, unspecified eye: Secondary | ICD-10-CM

## 2013-10-06 ENCOUNTER — Other Ambulatory Visit: Payer: Self-pay | Admitting: Physician Assistant

## 2013-10-28 ENCOUNTER — Telehealth: Payer: Self-pay

## 2013-10-28 MED ORDER — LORATADINE 10 MG PO TABS: ORAL_TABLET | ORAL | Status: DC

## 2013-10-28 NOTE — Telephone Encounter (Signed)
Patient advised Claritin resent, she should have 9 remaining.

## 2013-10-28 NOTE — Telephone Encounter (Signed)
Patient is calling to get a refill on her Claritin she has no refills please call patient at [phone]

## 2014-05-26 ENCOUNTER — Encounter: Payer: BC Managed Care – PPO | Admitting: Emergency Medicine

## 2014-06-15 ENCOUNTER — Telehealth: Payer: Self-pay

## 2014-06-15 DIAGNOSIS — R059 Cough, unspecified: Secondary | ICD-10-CM

## 2014-06-15 DIAGNOSIS — R05 Cough: Secondary | ICD-10-CM

## 2014-06-15 MED ORDER — BENZONATATE 100 MG PO CAPS: 100.0000 mg | ORAL_CAPSULE | Freq: Three times a day (TID) | ORAL | Status: DC | PRN

## 2014-06-15 MED ORDER — AZITHROMYCIN 250 MG PO TABS: ORAL_TABLET | ORAL | Status: DC

## 2014-06-15 NOTE — Telephone Encounter (Signed)
Dr Cleta Albertsaub talked to pt this morning who reported she has cough, ST and fever. He asked me to document her Sxs and send in Rxs for Z pack and tessalon perles for pt to [facility] [place] is on [place].

## 2014-07-28 ENCOUNTER — Ambulatory Visit (INDEPENDENT_AMBULATORY_CARE_PROVIDER_SITE_OTHER): Payer: BC Managed Care – PPO

## 2014-07-28 ENCOUNTER — Ambulatory Visit (INDEPENDENT_AMBULATORY_CARE_PROVIDER_SITE_OTHER): Payer: BC Managed Care – PPO | Admitting: Emergency Medicine

## 2014-07-28 ENCOUNTER — Encounter: Payer: Self-pay | Admitting: Emergency Medicine

## 2014-07-28 VITALS — BP 108/70 | HR 74 | Temp 98.2°F | Resp 18 | Ht 61.0 in | Wt 108.0 lb

## 2014-07-28 DIAGNOSIS — Z0000 Encounter for general adult medical examination without abnormal findings: Secondary | ICD-10-CM

## 2014-07-28 DIAGNOSIS — Z1211 Encounter for screening for malignant neoplasm of colon: Secondary | ICD-10-CM

## 2014-07-28 DIAGNOSIS — B009 Herpesviral infection, unspecified: Secondary | ICD-10-CM

## 2014-07-28 DIAGNOSIS — M79674 Pain in right toe(s): Secondary | ICD-10-CM

## 2014-07-28 DIAGNOSIS — R000 Tachycardia, unspecified: Secondary | ICD-10-CM

## 2014-07-28 DIAGNOSIS — M79609 Pain in unspecified limb: Secondary | ICD-10-CM

## 2014-07-28 LAB — POCT GLYCOSYLATED HEMOGLOBIN (HGB A1C): HEMOGLOBIN A1C: 4.7

## 2014-07-28 MED ORDER — LORATADINE 10 MG PO TABS: 10.0000 mg | ORAL_TABLET | Freq: Every day | ORAL | Status: DC

## 2014-07-28 MED ORDER — VALACYCLOVIR HCL 1 G PO TABS: ORAL_TABLET | ORAL | Status: DC

## 2014-07-28 NOTE — Progress Notes (Signed)
This chart was scribed for Collene Gobble, MD by Luisa Dago, ED Scribe. This patient was seen in room 22 and the patient's care was started at 4:09 PM.  Subjective:    Patient ID: Monique Stephens, female    DOB: [birth year], 48 y.o.   MRN: [id number]  Chief Complaint  Patient presents with  . Annual Exam    HPI Monique Stephens is a 48 y.o. female with active problem list below. Pt was last seen by me on 05/06/13 for a CPE. Pt sees Dr. Leveda Anna as her GYN. She is under the care of Dr. Elnoria Howard for the GI problems. Pt was advised to stop going to the tanning bed because she does it frequently.   Today, pt states that she is doing well. She denies any GI problems at the time. She does endorse a family history of colon cancer. Pt states that she has had an early screening done already, but she would like to be screened again. Because her father had a hx of polyps.   Pt had a hysterectomy back in 2001. She no longer has periods.   Pt injured her last toe of her right foot. It has been 6 weeks since the injury. She states that the area is healing well and she no longer experiences tenderness to palpation.  Monique Stephens decline a flu vaccine at the time. She states that she is leaving November 1st to go on a cruise to the [place]. She states that she will update her tetanus when she returns. She denies any skin changes.  Pt was advised to check her skin for any suspicious moles since she tans a lot.    Patient Active Problem List   Diagnosis Date Noted  . Fever blister   . Kidney stones, mixed calcium oxalate 07/30/2012  . RUQ pain 07/30/2012   Past Medical History  Diagnosis Date  . Allergy   . Fever blister    Past Surgical History  Procedure Laterality Date  . Tubal ligation    . Abdominal hysterectomy    . Cesarean section    . Hernia repair  1969   Allergies  Allergen Reactions  . Atropine Hives  . Caffeine Other (See Comments)    Shakes; hot/cold flashes;dizziness    Prior to Admission medications   Medication Sig Start Date End Date Taking? Authorizing Provider  valACYclovir (VALTREX) 1000 MG tablet TAKE 2 TABLETS BY MOUTH TWICE DAILY FOR 2 DOSES 09/11/12  Yes Heather M Marte, PA-C  dicyclomine (BENTYL) 10 MG capsule Take 1 capsule (10 mg total) by mouth 4 (four) times daily -  before meals and at bedtime. 03/22/12 03/22/13  Peyton Najjar, MD  ibuprofen (ADVIL,MOTRIN) 200 MG tablet Take 200 mg by mouth every 6 (six) hours as needed.    Historical Provider, MD   History   Social History  . Marital Status: Single    Spouse Name: n/a    Number of Children: 2  . Years of Education: 12   Occupational History  . Supervisor Dispensing optician   Social History Main Topics  . Smoking status: Never Smoker   . Smokeless tobacco: Never Used  . Alcohol Use: Yes     Comment: occasionally  . Drug Use: No  . Sexual Activity: Yes    Partners: Male    Birth Control/ Protection: Surgical   Other Topics Concern  . Not on file   Social History Narrative   Lives in her  own home, her boyfriend lives with her.  Her daughter lives with her intermittently.     Review of Systems  Constitutional: Negative for fatigue and unexpected weight change.  Respiratory: Negative for chest tightness and shortness of breath.   Cardiovascular: Negative for chest pain, palpitations and leg swelling.  Gastrointestinal: Negative for abdominal pain and blood in stool.  Neurological: Negative for dizziness, syncope, light-headedness and headaches.       Objective:   Physical Exam  Nursing note and vitals reviewed. CONSTITUTIONAL: Well developed/well nourished HEAD: Normocephalic/atraumatic EYES: EOMI/PERRL ENMT: Mucous membranes moist NECK: supple no meningeal signs SPINE:entire spine nontender CV: S1/S2 noted, no murmurs/rubs/gallops noted LUNGS: Lungs are clear to auscultation bilaterally, no apparent distress ABDOMEN: soft, nontender, no rebound or  guarding GU:no cva tenderness NEURO: Pt is awake/alert, moves all extremitiesx4 EXTREMITIES: pulses normal, full ROM SKIN: warm, color normal PSYCH: no abnormalities of mood noted   Filed Vitals:   07/28/14 1553  BP: 108/70  Pulse: 108  Temp: 98.2 F (36.8 C)  Resp: 18  Height:  (1.549 m)  Weight: 108 lb (48.988 kg)  SpO2: 99%   [facility] reading of Right foot X-Ray (PRIMARY) by  Dr. Peggyann Juba fracture proximal phalanx 5th toe        Assessment & Plan:  This pt is doing well no abdominal pain for the past year. She is referred back to Dr. Elnoria Howard for a screening colonoscopy. She decline a flu shot and a tetanus shot today. Routine labs were done. Will re- X-ray her fractured right 5th toe. I personally performed the services described in this documentation, which was scribed in my presence. The recorded information has been reviewed and is accurate.

## 2014-07-29 LAB — CBC WITH DIFFERENTIAL/PLATELET
BASOS PCT: 2 % — AB (ref 0–1)
Basophils Absolute: 0.1 10*3/uL (ref 0.0–0.1)
Eosinophils Absolute: 0.1 10*3/uL (ref 0.0–0.7)
Eosinophils Relative: 3 % (ref 0–5)
HEMATOCRIT: 41.3 % (ref 36.0–46.0)
Hemoglobin: 13.6 g/dL (ref 12.0–15.0)
LYMPHS PCT: 33 % (ref 12–46)
Lymphs Abs: 1.3 10*3/uL (ref 0.7–4.0)
MCH: 30.1 pg (ref 26.0–34.0)
MCHC: 32.9 g/dL (ref 30.0–36.0)
MCV: 91.4 fL (ref 78.0–100.0)
MONO ABS: 0.4 10*3/uL (ref 0.1–1.0)
Monocytes Relative: 11 % (ref 3–12)
NEUTROS ABS: 2 10*3/uL (ref 1.7–7.7)
NEUTROS PCT: 51 % (ref 43–77)
PLATELETS: 231 10*3/uL (ref 150–400)
RBC: 4.52 MIL/uL (ref 3.87–5.11)
RDW: 13.5 % (ref 11.5–15.5)
WBC: 4 10*3/uL (ref 4.0–10.5)

## 2014-07-29 LAB — LIPID PANEL
CHOLESTEROL: 145 mg/dL (ref 0–200)
HDL: 50 mg/dL (ref >39)
LDL CALC: 86 mg/dL (ref 0–99)
TRIGLYCERIDES: 44 mg/dL (ref <150)
Total CHOL/HDL Ratio: 2.9 Ratio
VLDL: 9 mg/dL (ref 0–40)

## 2014-07-29 LAB — COMPLETE METABOLIC PANEL WITH GFR
ALBUMIN: 4.2 g/dL (ref 3.5–5.2)
ALT: 17 U/L (ref 0–35)
AST: 22 U/L (ref 0–37)
Alkaline Phosphatase: 40 U/L (ref 39–117)
BUN: 17 mg/dL (ref 6–23)
CALCIUM: 9 mg/dL (ref 8.4–10.5)
CHLORIDE: 104 meq/L (ref 96–112)
CO2: 26 mEq/L (ref 19–32)
Creat: 0.81 mg/dL (ref 0.50–1.10)
GFR, Est African American: >89 mL/min
GFR, Est Non African American: 85 mL/min
Glucose, Bld: 78 mg/dL (ref 70–99)
Potassium: 3.9 mEq/L (ref 3.5–5.3)
SODIUM: 138 meq/L (ref 135–145)
TOTAL PROTEIN: 6.9 g/dL (ref 6.0–8.3)
Total Bilirubin: 0.9 mg/dL (ref 0.2–1.2)

## 2014-07-29 LAB — TSH: TSH: 1.771 u[IU]/mL (ref 0.350–4.500)

## 2015-04-21 ENCOUNTER — Encounter: Payer: Self-pay | Admitting: Physician Assistant

## 2015-04-21 ENCOUNTER — Ambulatory Visit (INDEPENDENT_AMBULATORY_CARE_PROVIDER_SITE_OTHER): Payer: BLUE CROSS/BLUE SHIELD | Admitting: Physician Assistant

## 2015-04-21 VITALS — BP 95/62 | HR 94 | Temp 99.5°F | Resp 16 | Ht 61.25 in | Wt 111.8 lb

## 2015-04-21 DIAGNOSIS — Z0000 Encounter for general adult medical examination without abnormal findings: Secondary | ICD-10-CM

## 2015-04-21 DIAGNOSIS — Z130 Encounter for screening for diseases of the blood and blood-forming organs and certain disorders involving the immune mechanism: Secondary | ICD-10-CM

## 2015-04-21 DIAGNOSIS — Z13220 Encounter for screening for lipoid disorders: Secondary | ICD-10-CM

## 2015-04-21 DIAGNOSIS — Z13228 Encounter for screening for other metabolic disorders: Secondary | ICD-10-CM | POA: Diagnosis not present

## 2015-04-21 DIAGNOSIS — Z1329 Encounter for screening for other suspected endocrine disorder: Secondary | ICD-10-CM

## 2015-04-21 NOTE — Progress Notes (Signed)
Monique SmothersKaren Marie Stephens  MRN: [id number] DOB: 02/28/1966  Subjective:  Pt presents to clinic for CPE. She has a form that needs to be filled out for insurance purposes.  She is doing well and has no complaints today.  She had cosmetic surgery (tummy tuck and breast augmentation) last week and is very sore from that.  Last dental exam: 4/16 Last vision exam: 2014 - wears glasses Last pap smear: [place] - 11/15 normal Last mammogram: 11/15 - normal Last colonoscopy: plans to schedule - Dr Elnoria HowardHung  Patient Active Problem List   Diagnosis Date Noted  . Fever blister   . Kidney stones, mixed calcium oxalate 07/30/2012  . RUQ pain 07/30/2012    The patient has a current medication list which includes the following prescription(s): loratadine, valacyclovir.  Allergies  Allergen Reactions  . Atropine Hives  . Caffeine Other (See Comments)    Shakes; hot/cold flashes;dizziness    History   Social History  . Marital Status: Single    Spouse Name: n/a  . Number of Children: 2  . Years of Education: 12   Occupational History  . 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 Smoking status: Never Smoker   . Smokeless tobacco: Never Used  . Alcohol Use: Yes     Comment: occasionally  . Drug Use: No  . Sexual Activity:    Partners: Male    Birth Control/ Protection: Surgical   Other Topics Concern  . None   Social History Narrative   Lives in her own home, her boyfriend lives with her.  Her daughter lives with her intermittently.    Past Surgical History  Procedure Laterality Date  . Tubal ligation    . Abdominal hysterectomy    . Cesarean section    . Hernia repair  1969  . Cosmetic surgery    . Breast surgery      Family History  Problem Relation Age of Onset  . Cancer Mother [id number]    Non-Hodgkin's Lymphoma  . Alzheimer's disease Father   . Hyperlipidemia Father     Review of Systems  All other systems reviewed and are  negative.  Objective:  Physical Exam  Constitutional: She is oriented to person, place, and time and well-developed, well-nourished, and in no distress.  HENT:  Head: Normocephalic and atraumatic.  Right Ear: Hearing, tympanic membrane, external ear and ear canal normal.  Left Ear: Hearing, tympanic membrane, external ear and ear canal normal.  Nose: Nose normal.  Mouth/Throat: Uvula is midline, oropharynx is clear and moist and mucous membranes are normal.  Eyes: Conjunctivae and EOM are normal. Pupils are equal, round, and reactive to light.  Neck: Normal range of motion.  Cardiovascular: Normal rate, regular rhythm and normal heart sounds.   No murmur heard. Pulmonary/Chest: Effort normal and breath sounds normal.  Abdominal:  Ecchymosis present abdomin with drains present and incision that is healing well without signs on infection are present from her surgery.  Neurological: She is alert and oriented to person, place, and time. Gait normal.  Skin: Skin is warm and dry.  Psychiatric: Mood, memory, affect and judgment normal.   Assessment and Plan :  Annual physical exam  Screening for metabolic disorder - Plan: COMPLETE METABOLIC PANEL WITH GFR  Screening for deficiency anemia - Plan: CBC with Differential/Platelet  Screening cholesterol level - Plan: Lipid panel  Screening for thyroid disorder - Plan: TSH  Anticipatory guidance given. Declines tetanus vaccine today and  she will make an appt with Dr Elnoria Howard for her colonoscopy.  Her form was filled out and she will add the lab values when they are available.  Benny Lennert PA-C  [facility] 04/21/2015 1:33 PM

## 2015-04-21 NOTE — Progress Notes (Signed)
   Subjective:    Patient ID: Monique SmothersKaren Marie Plueger, female    DOB: January 22, 1966, 49 y.o.   MRN: [id number]  HPI    Review of Systems  Constitutional: Negative.   HENT: Negative.   Eyes: Negative.   Respiratory: Negative.   Cardiovascular: Negative.   Gastrointestinal: Negative.   Endocrine: Negative.   Genitourinary: Negative.   Musculoskeletal: Negative.   Skin: Negative.   Allergic/Immunologic: Negative.   Neurological: Negative.   Hematological: Negative.   Psychiatric/Behavioral: Negative.        Objective:   Physical Exam        Assessment & Plan:

## 2015-04-21 NOTE — Patient Instructions (Signed)

## 2015-04-22 ENCOUNTER — Encounter: Payer: Self-pay | Admitting: Physician Assistant

## 2015-04-22 LAB — COMPLETE METABOLIC PANEL WITH GFR
ALBUMIN: 4.3 g/dL (ref 3.5–5.2)
ALT: 26 U/L (ref 0–35)
AST: 22 U/L (ref 0–37)
Alkaline Phosphatase: 50 U/L (ref 39–117)
BUN: 19 mg/dL (ref 6–23)
CALCIUM: 9.6 mg/dL (ref 8.4–10.5)
CO2: 22 mEq/L (ref 19–32)
CREATININE: 0.75 mg/dL (ref 0.50–1.10)
Chloride: 103 mEq/L (ref 96–112)
GFR, Est African American: >89 mL/min
GFR, Est Non African American: >89 mL/min
Glucose, Bld: 88 mg/dL (ref 70–99)
Potassium: 4.2 mEq/L (ref 3.5–5.3)
Sodium: 138 mEq/L (ref 135–145)
TOTAL PROTEIN: 7.3 g/dL (ref 6.0–8.3)
Total Bilirubin: 0.6 mg/dL (ref 0.2–1.2)

## 2015-04-22 LAB — CBC WITH DIFFERENTIAL/PLATELET
BASOS ABS: 0.1 10*3/uL (ref 0.0–0.1)
Basophils Relative: 1 % (ref 0–1)
EOS ABS: 0.4 10*3/uL (ref 0.0–0.7)
Eosinophils Relative: 5 % (ref 0–5)
HEMATOCRIT: 44.4 % (ref 36.0–46.0)
Hemoglobin: 14.5 g/dL (ref 12.0–15.0)
Lymphocytes Relative: 21 % (ref 12–46)
Lymphs Abs: 1.7 10*3/uL (ref 0.7–4.0)
MCH: 29.8 pg (ref 26.0–34.0)
MCHC: 32.7 g/dL (ref 30.0–36.0)
MCV: 91.4 fL (ref 78.0–100.0)
MONO ABS: 0.7 10*3/uL (ref 0.1–1.0)
MONOS PCT: 9 % (ref 3–12)
MPV: 11 fL (ref 8.6–12.4)
NEUTROS ABS: 5.2 10*3/uL (ref 1.7–7.7)
Neutrophils Relative %: 64 % (ref 43–77)
Platelets: 293 10*3/uL (ref 150–400)
RBC: 4.86 MIL/uL (ref 3.87–5.11)
RDW: 13.7 % (ref 11.5–15.5)
WBC: 8.1 10*3/uL (ref 4.0–10.5)

## 2015-04-22 LAB — LIPID PANEL
CHOL/HDL RATIO: 3.6 ratio
CHOLESTEROL: 177 mg/dL (ref 0–200)
HDL: 49 mg/dL (ref >=46)
LDL CALC: 117 mg/dL — AB (ref 0–99)
Triglycerides: 53 mg/dL (ref <150)
VLDL: 11 mg/dL (ref 0–40)

## 2015-04-22 LAB — TSH: TSH: 1.96 u[IU]/mL (ref 0.350–4.500)

## 2015-04-23 ENCOUNTER — Encounter: Payer: Self-pay | Admitting: Physician Assistant

## 2015-06-16 ENCOUNTER — Encounter: Payer: Self-pay | Admitting: Family Medicine

## 2015-08-17 ENCOUNTER — Other Ambulatory Visit: Payer: Self-pay | Admitting: Emergency Medicine

## 2015-08-18 ENCOUNTER — Other Ambulatory Visit: Payer: Self-pay | Admitting: Emergency Medicine

## 2015-08-23 ENCOUNTER — Telehealth: Payer: Self-pay

## 2015-08-23 NOTE — Telephone Encounter (Signed)
Spoke with pt, advised her to call when her Claritin needs refill. She can have refills up to a year. Pt understood.

## 2015-08-23 NOTE — Telephone Encounter (Signed)
Pt have questions regarding refills which she think she should have gotten more of and didn't. Please call [phone]

## 2015-09-25 IMAGING — CR DG TOE 5TH 2+V*R*
2 series · 2 of 2 positions shown · non-contrast
Comparison: None.

CLINICAL DATA: Injury to right 5th toe.

EXAM:
RIGHT FIFTH TOE 3 VIEWS

[AP]
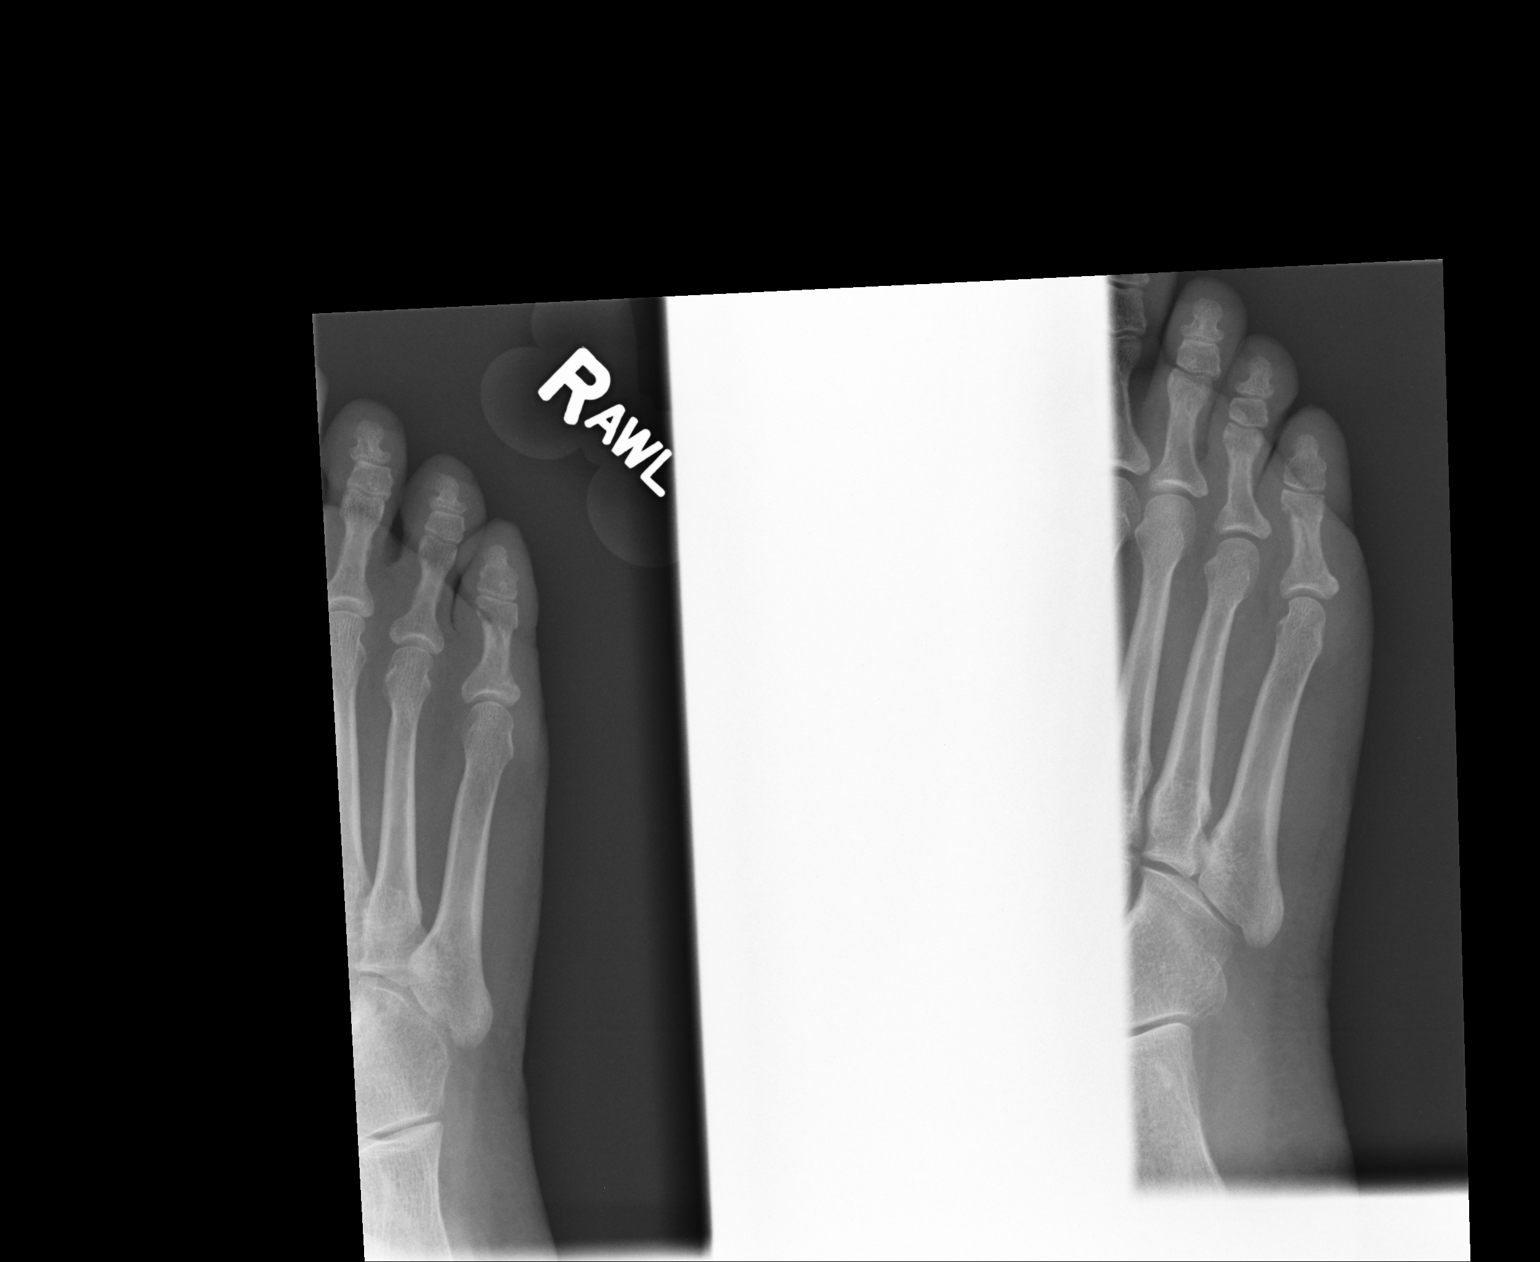

[lateral]
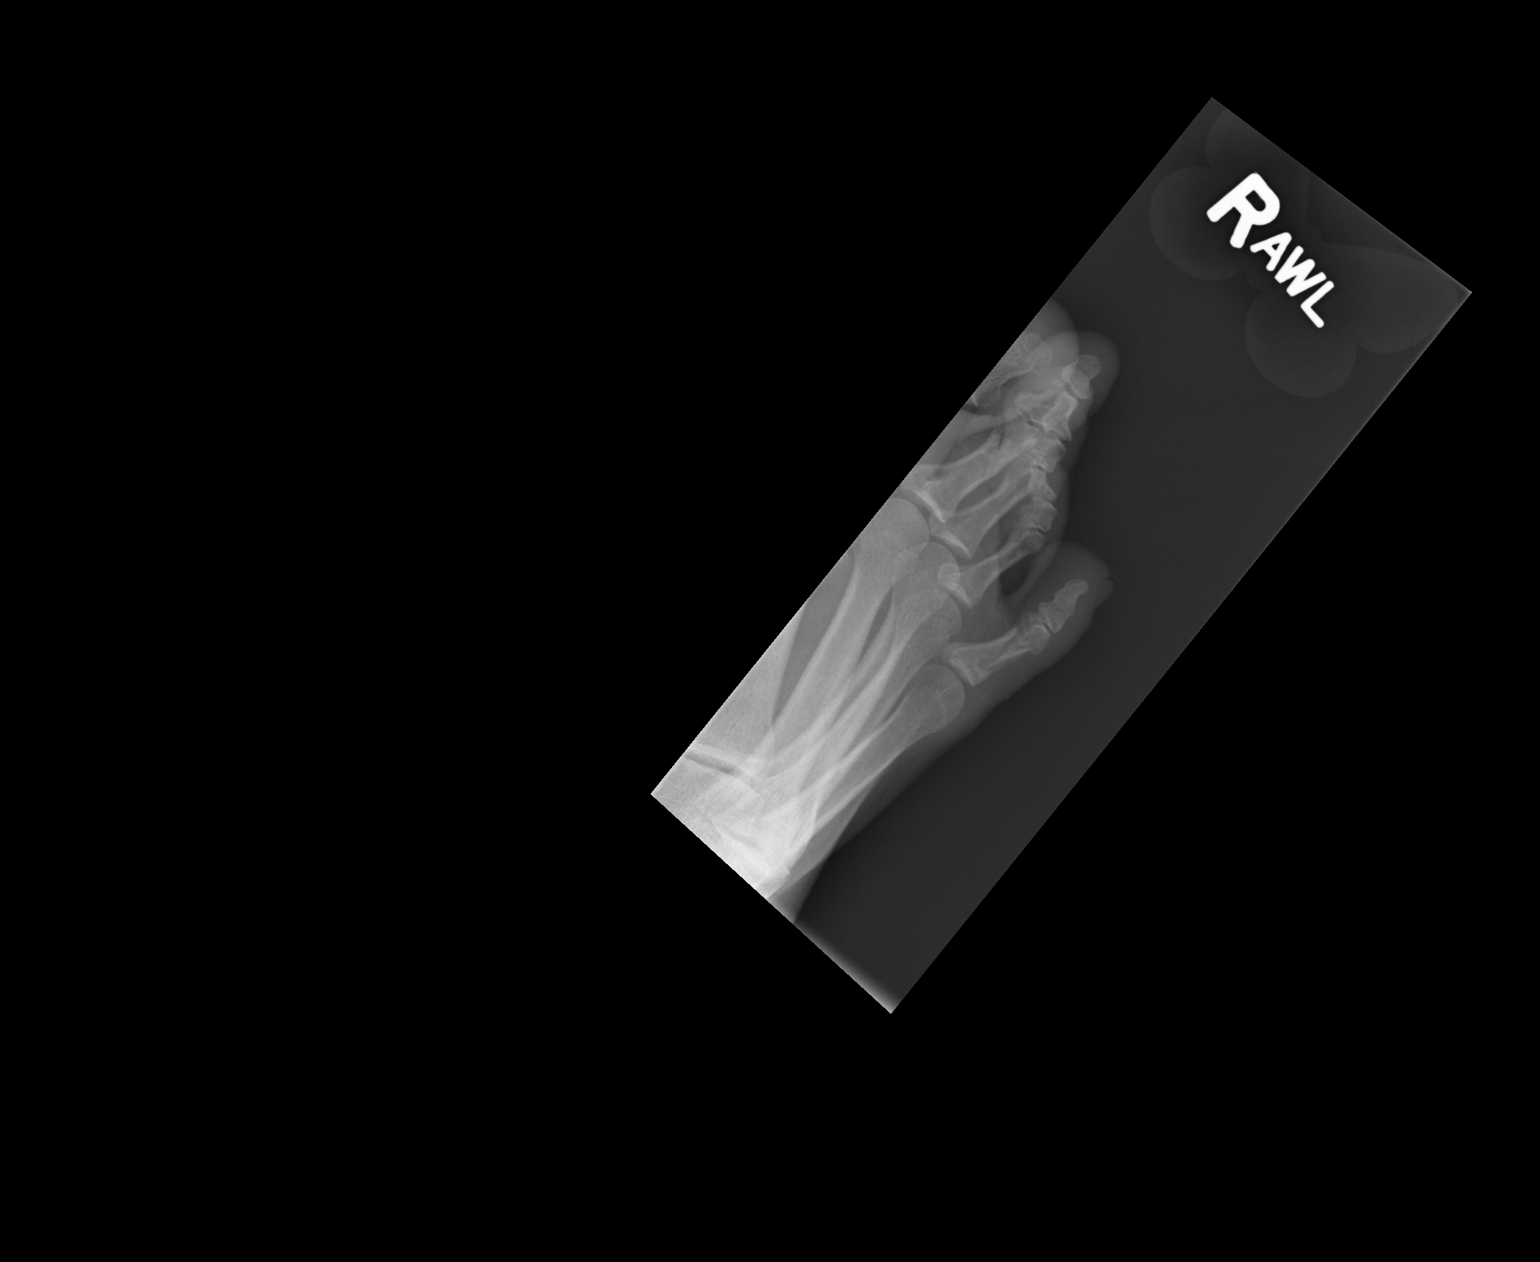

[2 of 2 positions shown; findings below may reference images not displayed]

FINDINGS: Nondisplaced oblique fracture involving the proximal phalanx. No
other fractures. Well preserved joint spaces. Well preserved bone
mineral density.
IMPRESSION: Nondisplaced oblique fracture involving the proximal phalanx.

## 2016-02-06 ENCOUNTER — Other Ambulatory Visit: Payer: Self-pay | Admitting: Physician Assistant

## 2016-02-24 ENCOUNTER — Telehealth: Payer: Self-pay

## 2016-02-24 NOTE — Telephone Encounter (Signed)
Pt is having a hard time understanding the new rules of scheduling an appt and is needing a cpe with dr Cleta Albertsdaub which he does not have available please call   [phone]

## 2016-02-28 NOTE — Telephone Encounter (Signed)
I will call patient about appointment issues.

## 2016-03-09 ENCOUNTER — Ambulatory Visit (INDEPENDENT_AMBULATORY_CARE_PROVIDER_SITE_OTHER): Payer: BLUE CROSS/BLUE SHIELD | Admitting: Emergency Medicine

## 2016-03-09 ENCOUNTER — Encounter: Payer: Self-pay | Admitting: Emergency Medicine

## 2016-03-09 VITALS — BP 118/80 | HR 73 | Temp 99.3°F | Resp 16 | Ht 61.0 in | Wt 112.2 lb

## 2016-03-09 DIAGNOSIS — Z131 Encounter for screening for diabetes mellitus: Secondary | ICD-10-CM

## 2016-03-09 DIAGNOSIS — Z0000 Encounter for general adult medical examination without abnormal findings: Secondary | ICD-10-CM

## 2016-03-09 DIAGNOSIS — Z1211 Encounter for screening for malignant neoplasm of colon: Secondary | ICD-10-CM

## 2016-03-09 DIAGNOSIS — Z114 Encounter for screening for human immunodeficiency virus [HIV]: Secondary | ICD-10-CM

## 2016-03-09 DIAGNOSIS — Z23 Encounter for immunization: Secondary | ICD-10-CM

## 2016-03-09 DIAGNOSIS — Z13220 Encounter for screening for lipoid disorders: Secondary | ICD-10-CM

## 2016-03-09 DIAGNOSIS — Z1159 Encounter for screening for other viral diseases: Secondary | ICD-10-CM

## 2016-03-09 LAB — CBC WITH DIFFERENTIAL/PLATELET
BASOS PCT: 1 %
Basophils Absolute: 46 cells/uL (ref 0–200)
Eosinophils Absolute: 92 cells/uL (ref 15–500)
Eosinophils Relative: 2 %
HEMATOCRIT: 43.4 % (ref 35.0–45.0)
HEMOGLOBIN: 14.2 g/dL (ref 11.7–15.5)
LYMPHS ABS: 1518 {cells}/uL (ref 850–3900)
Lymphocytes Relative: 33 %
MCH: 29.5 pg (ref 27.0–33.0)
MCHC: 32.7 g/dL (ref 32.0–36.0)
MCV: 90.2 fL (ref 80.0–100.0)
MONO ABS: 414 {cells}/uL (ref 200–950)
MPV: 10.8 fL (ref 7.5–12.5)
Monocytes Relative: 9 %
Neutro Abs: 2530 cells/uL (ref 1500–7800)
Neutrophils Relative %: 55 %
Platelets: 217 10*3/uL (ref 140–400)
RBC: 4.81 MIL/uL (ref 3.80–5.10)
RDW: 13.3 % (ref 11.0–15.0)
WBC: 4.6 10*3/uL (ref 3.8–10.8)

## 2016-03-09 LAB — COMPLETE METABOLIC PANEL WITH GFR
ALBUMIN: 4.4 g/dL (ref 3.6–5.1)
ALK PHOS: 47 U/L (ref 33–130)
ALT: 21 U/L (ref 6–29)
AST: 24 U/L (ref 10–35)
BILIRUBIN TOTAL: 0.8 mg/dL (ref 0.2–1.2)
BUN: 16 mg/dL (ref 7–25)
CO2: 24 mmol/L (ref 20–31)
CREATININE: 0.92 mg/dL (ref 0.50–1.05)
Calcium: 9.5 mg/dL (ref 8.6–10.4)
Chloride: 104 mmol/L (ref 98–110)
GFR, EST NON AFRICAN AMERICAN: 72 mL/min (ref >=60)
GFR, Est African American: 83 mL/min (ref >=60)
GLUCOSE: 78 mg/dL (ref 65–99)
Potassium: 4.2 mmol/L (ref 3.5–5.3)
SODIUM: 138 mmol/L (ref 135–146)
TOTAL PROTEIN: 7.4 g/dL (ref 6.1–8.1)

## 2016-03-09 LAB — TSH: TSH: 1.15 m[IU]/L

## 2016-03-09 LAB — HEMOGLOBIN A1C
Hgb A1c MFr Bld: 5.4 % (ref <5.7)
Mean Plasma Glucose: 108 mg/dL

## 2016-03-09 LAB — LIPID PANEL
Cholesterol: 172 mg/dL (ref 125–200)
HDL: 67 mg/dL (ref >=46)
LDL Cholesterol: 96 mg/dL (ref <130)
Total CHOL/HDL Ratio: 2.6 Ratio (ref <=5.0)
Triglycerides: 44 mg/dL (ref <150)
VLDL: 9 mg/dL (ref <30)

## 2016-03-09 LAB — HEPATITIS C ANTIBODY: HCV Ab: NEGATIVE

## 2016-03-09 LAB — HIV ANTIBODY (ROUTINE TESTING W REFLEX): HIV: NONREACTIVE

## 2016-03-09 MED ORDER — LORATADINE 10 MG PO TABS: 10.0000 mg | ORAL_TABLET | Freq: Every day | ORAL | Status: DC

## 2016-03-09 MED ORDER — LORATADINE 10 MG PO TABS
10.0000 mg | ORAL_TABLET | Freq: Every day | ORAL | Status: DC
Start: 2016-03-09 — End: 2016-03-09

## 2016-03-09 MED ORDER — VALACYCLOVIR HCL 1 G PO TABS: ORAL_TABLET | ORAL | Status: DC

## 2016-03-09 MED ORDER — VALACYCLOVIR HCL 1 G PO TABS
ORAL_TABLET | ORAL | Status: DC
Start: 2016-03-09 — End: 2017-02-13

## 2016-03-09 NOTE — Patient Instructions (Addendum)
IF you received an x-ray today, you will receive an invoice from [facility]. Please contact [facility] at [phone] with questions or concerns regarding your invoice.   IF you received labwork today, you will receive an invoice from [employer]. Please contact [facility] at [phone] with questions or concerns regarding your invoice.   Our billing staff will not be able to assist you with questions regarding bills from these companies.  You will be contacted with the lab results as soon as they are available. The fastest way to get your results is to activate your My Chart account. Instructions are located on the last page of this paperwork. If you have not heard from [place] regarding the results in 2 weeks, please contact this office.    Health mainMenopause is a normal process in which your reproductive ability comes to an end. This process happens gradually over a span of months to years, usually between the ages of 52 and 52. Menopause is complete when you have missed 12 consecutive menstrual periods. It is important to talk with your health care provider about some of the most common conditions that affect postmenopausal women, such as heart disease, cancer, and bone loss (osteoporosis). Adopting a healthy lifestyle and getting preventive care can help to promote your health and wellness. Those actions can also lower your chances of developing some of these common conditions. WHAT SHOULD I KNOW ABOUT MENOPAUSE? During menopause, you may experience a number of symptoms, such as:  Moderate-to-severe hot flashes.  Night sweats.  Decrease in sex drive.  Mood swings.  Headaches.  Tiredness.  Irritability.  Memory problems.  Insomnia. Choosing to treat or not to treat menopausal changes is an individual decision that you make with your health care provider. WHAT SHOULD I KNOW ABOUT HORMONE REPLACEMENT THERAPY AND  SUPPLEMENTS? Hormone therapy products are effective for treating symptoms that are associated with menopause, such as hot flashes and night sweats. Hormone replacement carries certain risks, especially as you become older. If you are thinking about using estrogen or estrogen with progestin treatments, discuss the benefits and risks with your health care provider. WHAT SHOULD I KNOW ABOUT HEART DISEASE AND STROKE? Heart disease, heart attack, and stroke become more likely as you age. This may be due, in part, to the hormonal changes that your body experiences during menopause. These can affect how your body processes dietary fats, triglycerides, and cholesterol. Heart attack and stroke are both medical emergencies. There are many things that you can do to help prevent heart disease and stroke:  Have your blood pressure checked at least every 1-2 years. High blood pressure causes heart disease and increases the risk of stroke.  If you are 52-52 years old, ask your health care provider if you should take aspirin to prevent a heart attack or a stroke.  Do not use any tobacco products, including cigarettes, chewing tobacco, or electronic cigarettes. If you need help quitting, ask your health care provider.  It is important to eat a healthy diet and maintain a healthy weight.  Be sure to include plenty of vegetables, fruits, low-fat dairy products, and lean protein.  Avoid eating foods that are high in solid fats, added sugars, or salt (sodium).  Get regular exercise. This is one of the most important things that you can do for your health.  Try to exercise for at least 150 minutes each week. The type of exercise that you do should increase your heart rate and  make you sweat. This is known as moderate-intensity exercise.  Try to do strengthening exercises at least twice each week. Do these in addition to the moderate-intensity exercise.  Know your numbers.Ask your health care provider to check  your cholesterol and your blood glucose. Continue to have your blood tested as directed by your health care provider. WHAT SHOULD I KNOW ABOUT CANCER SCREENING? There are several types of cancer. Take the following steps to reduce your risk and to catch any cancer development as early as possible. Breast Cancer  Practice breast self-awareness.  This means understanding how your breasts normally appear and feel.  It also means doing regular breast self-exams. Let your health care provider know about any changes, no matter how small.  If you are 52 or older, have a clinician do a breast exam (clinical breast exam or CBE) every year. Depending on your age, family history, and medical history, it may be recommended that you also have a yearly breast X-ray (mammogram).  If you have a family history of breast cancer, talk with your health care provider about genetic screening.  If you are at high risk for breast cancer, talk with your health care provider about having an MRI and a mammogram every year.  Breast cancer (BRCA) gene test is recommended for women who have family members with BRCA-related cancers. Results of the assessment will determine the need for genetic counseling and BRCA1 and for BRCA2 testing. BRCA-related cancers include these types:  Breast. This occurs in males or females.  Ovarian.  Tubal. This may also be called fallopian tube cancer.  Cancer of the abdominal or pelvic lining (peritoneal cancer).  Prostate.  Pancreatic. Cervical, Uterine, and Ovarian Cancer Your health care provider may recommend that you be screened regularly for cancer of the pelvic organs. These include your ovaries, uterus, and vagina. This screening involves a pelvic exam, which includes checking for microscopic changes to the surface of your cervix (Pap test).  For women ages 52-52, health care providers may recommend a pelvic exam and a Pap test every three years. For women ages 52-52, they  may recommend the Pap test and pelvic exam, combined with testing for human papilloma virus (HPV), every five years. Some types of HPV increase your risk of cervical cancer. Testing for HPV may also be done on women of any age who have unclear Pap test results.  Other health care providers may not recommend any screening for nonpregnant women who are considered low risk for pelvic cancer and have no symptoms. Ask your health care provider if a screening pelvic exam is right for you.  If you have had past treatment for cervical cancer or a condition that could lead to cancer, you need Pap tests and screening for cancer for at least 20 years after your treatment. If Pap tests have been discontinued for you, your risk factors (such as having a new sexual partner) need to be reassessed to determine if you should start having screenings again. Some women have medical problems that increase the chance of getting cervical cancer. In these cases, your health care provider may recommend that you have screening and Pap tests more often.  If you have a family history of uterine cancer or ovarian cancer, talk with your health care provider about genetic screening.  If you have vaginal bleeding after reaching menopause, tell your health care provider.  There are currently no reliable tests available to screen for ovarian cancer. Lung Cancer Lung cancer screening is recommended  recommended for adults 55-80 years old who are at high risk for lung cancer because of a history of smoking. A yearly low-dose CT scan of the lungs is recommended if you:  Currently smoke.  Have a history of at least 30 pack-years of smoking and you currently smoke or have quit within the past 15 years. A pack-year is smoking an average of one pack of cigarettes per day for one year. Yearly screening should:  Continue until it has been 15 years since you quit.  Stop if you develop a health problem that would prevent you from having lung cancer  treatment. Colorectal Cancer  This type of cancer can be detected and can often be prevented.  Routine colorectal cancer screening usually begins at age 50 and continues through age 75.  If you have risk factors for colon cancer, your health care provider may recommend that you be screened at an earlier age.  If you have a family history of colorectal cancer, talk with your health care provider about genetic screening.  Your health care provider may also recommend using home test kits to check for hidden blood in your stool.  A small camera at the end of a tube can be used to examine your colon directly (sigmoidoscopy or colonoscopy). This is done to check for the earliest forms of colorectal cancer.  Direct examination of the colon should be repeated every 5-10 years until age 75. However, if early forms of precancerous polyps or small growths are found or if you have a family history or genetic risk for colorectal cancer, you may need to be screened more often. Skin Cancer  Check your skin from head to toe regularly.  Monitor any moles. Be sure to tell your health care provider:  About any new moles or changes in moles, especially if there is a change in a mole's shape or color.  If you have a mole that is larger than the size of a pencil eraser.  If any of your family members has a history of skin cancer, especially at a young age, talk with your health care provider about genetic screening.  Always use sunscreen. Apply sunscreen liberally and repeatedly throughout the day.  Whenever you are outside, protect yourself by wearing long sleeves, pants, a wide-brimmed hat, and sunglasses. WHAT SHOULD I KNOW ABOUT OSTEOPOROSIS? Osteoporosis is a condition in which bone destruction happens more quickly than new bone creation. After menopause, you may be at an increased risk for osteoporosis. To help prevent osteoporosis or the bone fractures that can happen because of osteoporosis, the  following is recommended:  If you are 19-50 years old, get at least 1,000 mg of calcium and at least 600 mg of vitamin D per day.  If you are older than age 50 but younger than age 70, get at least 1,200 mg of calcium and at least 600 mg of vitamin D per day.  If you are older than age 70, get at least 1,200 mg of calcium and at least 800 mg of vitamin D per day. Smoking and excessive alcohol intake increase the risk of osteoporosis. Eat foods that are rich in calcium and vitamin D, and do weight-bearing exercises several times each week as directed by your health care provider. WHAT SHOULD I KNOW ABOUT HOW MENOPAUSE AFFECTS MY MENTAL HEALTH? Depression may occur at any age, but it is more common as you become older. Common symptoms of depression include:  Low or sad mood.  Changes in sleep   Changes in appetite or eating patterns.  Feeling an overall lack of motivation or enjoyment of activities that you previously enjoyed.  Frequent crying spells. Talk with your health care provider if you think that you are experiencing depression. WHAT SHOULD I KNOW ABOUT IMMUNIZATIONS? It is important that you get and maintain your immunizations. These include:  Tetanus, diphtheria, and pertussis (Tdap) booster vaccine.  Influenza every year before the flu season begins.  Pneumonia vaccine.  Shingles vaccine. Your health care provider may also recommend other immunizations.   This information is not intended to replace advice given to you by your health care provider. Make sure you discuss any questions you have with your health care provider.   Document Released: 12/22/2005 Document Revised: 11/20/2014 Document Reviewed: 07/02/2014 Elsevier Interactive Patient Education [employer].

## 2016-03-09 NOTE — Progress Notes (Signed)
By signing my name below, I, Mesha Guinyard, attest that this documentation has been prepared under the direction and in the presence of Lesle ChrisSteven Bobbiejo Ishikawa, MD.  Electronically Signed: Arvilla MarketMesha Guinyard, Medical Scribe. 03/09/2016. 11:09 AM.  Chief Complaint:  Chief Complaint  Patient presents with  . Annual Exam    HPI: Monique Stephens is a 50 y.o. female [facility] insurance medical form. She mentions feeling well overall. She denies getting her colonoscopy recently. She denies going to dermatologist since she never had a hx, or FHx of skin problems. She likes to travel and be out doors.  She mentions needing blood work for July.  She mentions her RUQ has been good about a year now. She reports getting a mamogram every year. She reports having hysterectomy in 2001.  Past Medical History  Diagnosis Date  . Allergy   . Fever blister   . Blood transfusion without reported diagnosis 2001   Past Surgical History  Procedure Laterality Date  . Tubal ligation    . Abdominal hysterectomy    . Cesarean section    . Hernia repair  1969  . Breast surgery    . Cosmetic surgery      tummy tucks, bilateral breast implants   Social History   Social History  . Marital Status: Single    Spouse Name: n/a  . Number of Children: 2  . Years of Education: 12   Occupational History  . 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 Smoking status: Never Smoker   . Smokeless tobacco: Never Used  . Alcohol Use: Yes     Comment: occasionally  . Drug Use: No  . Sexual Activity:    Partners: Male    Birth Control/ Protection: Surgical   Other Topics Concern  . None   Social History Narrative   Lives in her own home, her boyfriend lives with her.  Her daughter lives with her intermittently.   Family History  Problem Relation Age of Onset  . Cancer Mother 261    Non-Hodgkin's Lymphoma  . Alzheimer's disease Father   . Hyperlipidemia Father     Allergies  Allergen Reactions  . Atropine Hives  . Caffeine Other (See Comments)    Shakes; hot/cold flashes;dizziness   Prior to Admission medications   Medication Sig Start Date End Date Taking? Authorizing Provider  loratadine (CLARITIN) 10 MG tablet TAKE ONE TABLET BY MOUTH ONE TIME DAILY 02/06/16  Yes Morrell RiddleSarah L Weber, PA-C  valACYclovir (VALTREX) 1000 MG tablet TAKE TWO TABLETS BY MOUTH TWICE DAILY FOR 2 DOSES 08/19/15  Yes Morrell RiddleSarah L Weber, PA-C     ROS: The patient denies fevers, chills, night sweats, unintentional weight loss, chest pain, palpitations, wheezing, dyspnea on exertion, nausea, vomiting, abdominal pain, dysuria, hematuria, melena, numbness, weakness, or tingling.   All other systems have been reviewed and were otherwise negative with the exception of those mentioned in the HPI and as above.    PHYSICAL EXAM: Filed Vitals:   03/09/16 1044  BP: 118/80  Pulse: 73  Temp: 99.3 F (37.4 C)  Resp: 16   Body mass index is 21.21 kg/(m^2).   General: Alert, no acute distress HEENT:  Normocephalic, atraumatic, oropharynx patent. Eye: Nonie H[facility]OMI, [facility] Cardiovascular:  Regular rate and rhythm, no rubs murmurs or gallops.  No Carotid bruits, radial pulse intact. No pedal edema.  Respiratory: Clear to auscultation bilaterally.  No wheezes, rales, or rhonchi.  No cyanosis, no use of accessory musculature Abdominal: No organomegaly, abdomen is soft and non-tender, positive bowel sounds.  No masses. Musculoskeletal: Gait intact. No edema, tenderness Skin: No rashes.there are surgical scars from previous plastic surgery on the abdomen. Neurologic: Facial musculature symmetric. Psychiatric: Patient acts appropriately throughout our interaction. Lymphatic: No cervical or submandibular lymphadenopathy Breastpatient has no masses she has had breast augmentation.   LABS:  EKG/XRAY:   Primary read interpreted by Dr. Cleta Alberts at [facility].   ASSESSMENT/PLAN: Her Valtrex was refilled.Her  Claritin was refilled. She was advised not to have the sun exposure she has is. She is not going to be compliant with this.She was advised of the risks of melanoma with prolonged sun exposure. She has a healthy lifestyle and exercises regularly. She is due to be married in January.I personally performed the services described in this documentation, which was scribed in my presence. The recorded information has been reviewed and is accurate.   Gross sideeffects, risk and benefits, and alternatives of medications d/w patient. Patient is aware that all medications have potential sideeffects and we are unable to predict every sideeffect or drug-drug interaction that may occur.  Lesle Chris MD 03/09/2016 11:08 AM

## 2016-03-12 ENCOUNTER — Telehealth: Payer: Self-pay | Admitting: Radiology

## 2016-03-12 NOTE — Telephone Encounter (Signed)
Patient wants to know if you can put in Future orders for blood work for her in January,2018. I have advised her not sure since you are retiring in October but will ask. She states she will need blood work for marriage license [place]. She wants someone to call her and let her know if orders for this can be put in as lab only for Jan 2018

## 2016-03-13 ENCOUNTER — Other Ambulatory Visit: Payer: Self-pay | Admitting: *Deleted

## 2016-03-13 DIAGNOSIS — Z0183 Encounter for blood typing: Secondary | ICD-10-CM

## 2016-03-13 DIAGNOSIS — Z113 Encounter for screening for infections with a predominantly sexual mode of transmission: Secondary | ICD-10-CM

## 2016-03-13 NOTE — Telephone Encounter (Signed)
I can do that except I need to know exactly what tests they require. If I am not able to do that I will make sure I'll assign  that task to one of the physician assistants.

## 2016-03-13 NOTE — Telephone Encounter (Signed)
Spoke with patient and she will need her blood work done on November 29 2016.  She will need blood type and cyphyillis so that [place].

## 2016-03-13 NOTE — Telephone Encounter (Signed)
Left a message for patient to return call.

## 2016-08-21 DIAGNOSIS — Z01419 Encounter for gynecological examination (general) (routine) without abnormal findings: Secondary | ICD-10-CM | POA: Diagnosis not present

## 2016-08-21 DIAGNOSIS — H40012 Open angle with borderline findings, low risk, left eye: Secondary | ICD-10-CM | POA: Diagnosis not present

## 2016-08-21 DIAGNOSIS — H40013 Open angle with borderline findings, low risk, bilateral: Secondary | ICD-10-CM | POA: Diagnosis not present

## 2016-08-21 DIAGNOSIS — H40011 Open angle with borderline findings, low risk, right eye: Secondary | ICD-10-CM | POA: Diagnosis not present

## 2016-08-21 DIAGNOSIS — Z130 Encounter for screening for diseases of the blood and blood-forming organs and certain disorders involving the immune mechanism: Secondary | ICD-10-CM | POA: Diagnosis not present

## 2016-08-21 DIAGNOSIS — Z1389 Encounter for screening for other disorder: Secondary | ICD-10-CM | POA: Diagnosis not present

## 2016-11-15 ENCOUNTER — Other Ambulatory Visit: Payer: Self-pay | Admitting: Physician Assistant

## 2016-11-15 ENCOUNTER — Telehealth: Payer: Self-pay | Admitting: Physician Assistant

## 2016-11-15 DIAGNOSIS — Z7184 Encounter for health counseling related to travel: Secondary | ICD-10-CM

## 2016-11-15 DIAGNOSIS — Z113 Encounter for screening for infections with a predominantly sexual mode of transmission: Secondary | ICD-10-CM

## 2016-11-15 NOTE — Telephone Encounter (Signed)
Please call patient on her cell phone and let her know that the lab orders have been placed for her and fiance to have blood drawn next week for their upcoming marriage.

## 2016-11-15 NOTE — Progress Notes (Signed)
Pt is getting [place] of [place] and they need RPR and blood type information to take with her for the wedding certificate. 

## 2016-11-16 NOTE — Telephone Encounter (Signed)
No vm

## 2016-11-18 NOTE — Telephone Encounter (Signed)
Called back and advised  

## 2016-11-21 ENCOUNTER — Other Ambulatory Visit: Payer: BLUE CROSS/BLUE SHIELD

## 2016-11-21 DIAGNOSIS — Z7184 Encounter for health counseling related to travel: Secondary | ICD-10-CM

## 2016-11-21 DIAGNOSIS — Z113 Encounter for screening for infections with a predominantly sexual mode of transmission: Secondary | ICD-10-CM | POA: Diagnosis not present

## 2016-11-21 DIAGNOSIS — Z7189 Other specified counseling: Secondary | ICD-10-CM | POA: Diagnosis not present

## 2016-11-22 LAB — RPR: RPR: NONREACTIVE

## 2016-11-22 LAB — ABO/RH: Rh Factor: NEGATIVE

## 2016-11-24 ENCOUNTER — Encounter: Payer: Self-pay | Admitting: Physician Assistant

## 2016-11-24 ENCOUNTER — Telehealth: Payer: Self-pay

## 2016-11-24 NOTE — Telephone Encounter (Signed)
Monique Stephens is aware.

## 2016-11-24 NOTE — Telephone Encounter (Signed)
Dr Cleta Albertsaub put in lab orders ,  Labs are back she has to have the results.    She called Dr. Cleta Albertsaub today and he told her to call the office and have Maralyn SagoSarah  look at the results   Dr. Cleta Albertsaub  will come in on Monday  To sign the reports needed. blood type has to have on the report along with the  Syphilis report.   Her blood type is o negative, his is either A or A negative - (stated we were testing them for her)   Mariea Stablelbert Morton - is the boy friends name .   Time frame factor - to be faxed in asap.  Cricital deadline   [phone]  Work (lunch from 11-12)  She expects Maralyn SagoSarah to call her after one o'clock today.

## 2016-11-24 NOTE — Telephone Encounter (Signed)
Patient wants a call back regarding her lab results from Tuesday, January 9,  [place] to [place] to [place].   [phone]

## 2016-11-24 NOTE — Telephone Encounter (Signed)
Pt came in and picked up needed info.

## 2016-11-26 NOTE — Telephone Encounter (Signed)
o neg rpr negative correct?

## 2016-11-27 DIAGNOSIS — H2513 Age-related nuclear cataract, bilateral: Secondary | ICD-10-CM | POA: Diagnosis not present

## 2016-11-27 DIAGNOSIS — H40011 Open angle with borderline findings, low risk, right eye: Secondary | ICD-10-CM | POA: Diagnosis not present

## 2016-11-27 DIAGNOSIS — H40013 Open angle with borderline findings, low risk, bilateral: Secondary | ICD-10-CM | POA: Diagnosis not present

## 2016-11-27 DIAGNOSIS — H25013 Cortical age-related cataract, bilateral: Secondary | ICD-10-CM | POA: Diagnosis not present

## 2016-11-27 DIAGNOSIS — H43813 Vitreous degeneration, bilateral: Secondary | ICD-10-CM | POA: Diagnosis not present

## 2016-11-27 DIAGNOSIS — H40012 Open angle with borderline findings, low risk, left eye: Secondary | ICD-10-CM | POA: Diagnosis not present

## 2016-11-28 NOTE — Telephone Encounter (Signed)
Correct - O neg and RPR neg

## 2016-12-04 NOTE — Telephone Encounter (Signed)
No vm

## 2017-01-11 ENCOUNTER — Ambulatory Visit (INDEPENDENT_AMBULATORY_CARE_PROVIDER_SITE_OTHER): Payer: BLUE CROSS/BLUE SHIELD | Admitting: Family Medicine

## 2017-01-11 VITALS — BP 120/84 | HR 80 | Temp 98.6°F | Resp 18 | Ht 61.0 in | Wt 113.0 lb

## 2017-01-11 DIAGNOSIS — J019 Acute sinusitis, unspecified: Secondary | ICD-10-CM

## 2017-01-11 DIAGNOSIS — J209 Acute bronchitis, unspecified: Secondary | ICD-10-CM | POA: Diagnosis not present

## 2017-01-11 MED ORDER — AZITHROMYCIN 250 MG PO TABS: ORAL_TABLET | ORAL | 0 refills | Status: DC

## 2017-01-11 MED ORDER — BENZONATATE 100 MG PO CAPS: 100.0000 mg | ORAL_CAPSULE | Freq: Three times a day (TID) | ORAL | 0 refills | Status: DC | PRN

## 2017-01-11 NOTE — Progress Notes (Signed)
Chief Complaint  Patient presents with  . Cough    Pt concerned of bronchitis    SUBJECTIVE:  Monique Stephens is a 51 y.o. female who complains of congestion, sore throat, productive cough and itching in eyes for 5 days. She denies a history of chills, fevers, body aches, nausea and vomiting and denies a history of asthma. Patient is a nonsmoker. Reports onset of symptoms beginning x 5 days ago. Saturday she was in a large crowd of  Monday, Saturday was in large crowd and feels she picked up an illness during that event. Has throat soreness with sensation of glottis, productive/nonproductive cough.  Past Medical History:  Diagnosis Date  . Allergy   . Blood transfusion without reported diagnosis 2001  . Fever blister    Social History   Social History  . Marital status: Single    Spouse name: n/a  . Number of children: 2  . Years of education: 12   Occupational History  . 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 Smoking status: Never Smoker  . Smokeless tobacco: Never Used  . Alcohol use Yes     Comment: occasionally  . Drug use: No  . Sexual activity: Yes    Partners: Male    Birth control/ protection: Surgical   Other Topics Concern  . Not on file   Social History Narrative   Lives in her own home, her boyfriend lives with her.  Her daughter lives with her intermittently.     OBJECTIVE:Blood pressure 120/84, pulse 80, temperature 98.6 F (37 C), temperature source Oral, resp. rate 18, height 5\' 1"  (1.549 m), weight 113 lb (51.3 kg), SpO2 99 %.  She appears sick, vital signs are as noted. Ears normal.  Throat and pharynx erythematous, without exudate, Neck supple. No adenopathy in the neck. Nose is congested with mucosal edema. Sinuses non tender. The chest is clear, although decreased air movement is present without wheezes or rales.  ASSESSMENT:  Sinusitis and likely Acute Bronchitis  PLAN: Start Azithromycin take 2 tabs x 1 dose,  then 1 tab every day for x 4 days. Take Benzonatate 100-200 mg up to 3 times daily for cough. Symptomatic therapy suggested: push fluids, rest and return office visit prn if symptoms persist or worsen.  Call or return to clinic prn if these symptoms worsen or fail to improve as anticipated.  Godfrey PickKimberly S. Tiburcio PeaHarris, MSN, FNP-C Primary Care at [facility] [phone]

## 2017-01-11 NOTE — Patient Instructions (Addendum)
Start Azithromycin Take 2 tabs x 1 dose, then 1 tab every day for x 4 days  Take Benzonatate 100-200 mg up to 3 times daily for cough.   IF you received an x-ray today, you will receive an invoice from [facility]. Please contact [facility] at [phone] with questions or concerns regarding your invoice.   IF you received labwork today, you will receive an invoice from [facility]. Please contact [facility] at [phone] with questions or concerns regarding your invoice.   Our billing staff will not be able to assist you with questions regarding bills from these companies.  You will be contacted with the lab results as soon as they are available. The fastest way to get your results is to activate your My Chart account. Instructions are located on the last page of this paperwork. If you have not heard from us regarding the results in 2 weeks, please contact this office.     Sinusitis, Adult Sinusitis is soreness and inflammation of your sinuses. Sinuses are hollow spaces in the bones around your face. They are located:  Around your eyes.  In the middle of your forehead.  Behind your nose.  In your cheekbones. Your sinuses and nasal passages are lined with a stringy fluid (mucus). Mucus normally drains out of your sinuses. When your nasal tissues get inflamed or swollen, the mucus can get trapped or blocked so air cannot flow through your sinuses. This lets bacteria, viruses, and funguses grow, and that leads to infection. Follow these instructions at home: Medicines   Take, use, or apply over-the-counter and prescription medicines only as told by your doctor. These may include nasal sprays.  If you were prescribed an antibiotic medicine, take it as told by your doctor. Do not stop taking the antibiotic even if you start to feel better. Hydrate and Humidify   Drink enough water to keep your pee (urine) clear or pale yellow.  Use a cool mist humidifier to keep the  humidity level in your home above 50%.  Breathe in steam for 10-15 minutes, 3-4 times a day or as told by your doctor. You can do this in the bathroom while a hot shower is running.  Try not to spend time in cool or dry air. Rest   Rest as much as possible.  Sleep with your head raised (elevated).  Make sure to get enough sleep each night. General instructions   Put a warm, moist washcloth on your face 3-4 times a day or as told by your doctor. This will help with discomfort.  Wash your hands often with soap and water. If there is no soap and water, use hand sanitizer.  Do not smoke. Avoid being around people who are smoking (secondhand smoke).  Keep all follow-up visits as told by your doctor. This is important. Contact a doctor if:  You have a fever.  Your symptoms get worse.  Your symptoms do not get better within 10 days. Get help right away if:  You have a very bad headache.  You cannot stop throwing up (vomiting).  You have pain or swelling around your face or eyes.  You have trouble seeing.  You feel confused.  Your neck is stiff.  You have trouble breathing. This information is not intended to replace advice given to you by your health care provider. Make sure you discuss any questions you have with your health care provider. Document Released: 04/17/2008 Document Revised: 06/25/2016 Document Reviewed: 08/25/2015 Elsevier Interactive Patient Education  2017 [employer] Inc[employer]  Acute Bronchitis, Adult Acute bronchitis is when air tubes (bronchi) in the lungs suddenly get swollen. The condition can make it hard to breathe. It can also cause these symptoms:  A cough.  Coughing up clear, yellow, or green mucus.  Wheezing.  Chest congestion.  Shortness of breath.  A fever.  Body aches.  Chills.  A sore throat. Follow these instructions at home: Medicines   Take over-the-counter and prescription medicines only as told by your doctor.  If you were  prescribed an antibiotic medicine, take it as told by your doctor. Do not stop taking the antibiotic even if you start to feel better. General instructions   Rest.  Drink enough fluids to keep your pee (urine) clear or pale yellow.  Avoid smoking and secondhand smoke. If you smoke and you need help quitting, ask your doctor. Quitting will help your lungs heal faster.  Use an inhaler, cool mist vaporizer, or humidifier as told by your doctor.  Keep all follow-up visits as told by your doctor. This is important. How is this prevented? To lower your risk of getting this condition again:  Wash your hands often with soap and water. If you cannot use soap and water, use hand sanitizer.  Avoid contact with people who have cold symptoms.  Try not to touch your hands to your mouth, nose, or eyes.  Make sure to get the flu shot every year. Contact a doctor if:  Your symptoms do not get better in 2 weeks. Get help right away if:  You cough up blood.  You have chest pain.  You have very bad shortness of breath.  You become dehydrated.  You faint (pass out) or keep feeling like you are going to pass out.  You keep throwing up (vomiting).  You have a very bad headache.  Your fever or chills gets worse. This information is not intended to replace advice given to you by your health care provider. Make sure you discuss any questions you have with your health care provider. Document Released: 04/17/2008 Document Revised: 06/07/2016 Document Reviewed: 04/19/2016 Elsevier Interactive Patient Education  2017 [employer].

## 2017-01-27 DIAGNOSIS — Z1231 Encounter for screening mammogram for malignant neoplasm of breast: Secondary | ICD-10-CM | POA: Diagnosis not present

## 2017-02-12 NOTE — Progress Notes (Signed)
Subjective:    Patient ID: Monique Stephens, female    DOB: January 17, 1966, 51 y.o.   MRN: [id number]  02/13/2017  Annual Exam (no pap) and Medication Refill (claritin)   HPI This 51 y.o. female presents for Complete Physical Examination.  Last physical: 03-09-2016 Pap smear:08-21-2016 [place] Mammogram:11-10-2015  [place]; just completed this month at [place]. Normal.  Prone to cyst; highly dense.  Colonoscopy:  Not yet.  Scheduled appointment with Elnoria Howard and pt unaware.   Eye exam: 11/2016; no g/c/md. Dental exam:  Every six months.  Immunization History  Administered Date(s) Administered  . Tdap 03/09/2016   BP Readings from Last 3 Encounters:  02/13/17 120/77  01/11/17 120/84  03/09/16 118/80   Wt Readings from Last 3 Encounters:  02/13/17 113 lb (51.3 kg)  01/11/17 113 lb (51.3 kg)  03/09/16 112 lb 3.2 oz (50.9 kg)   Insomnia: not sleeping; has tried for sleep aid.  Sleeps on back.  Wakes up several times a night.  Trampoline at night.  Wakes up four times per night; bathroom x 4.  Some insomnia nd some not.  Bedtime at 10:00; wakes up at 6:00am.  Rare caffeine after 3:00pm; one cup of coffee per day.  At lunch, might have a soda.  Drinks water.  No tea.  Not interested in other medication.  Intolerant to many medications.   Review of Systems  Constitutional: Negative for activity change, appetite change, chills, diaphoresis, fatigue, fever and unexpected weight change.  HENT: Negative for congestion, dental problem, drooling, ear discharge, ear pain, facial swelling, hearing loss, mouth sores, nosebleeds, postnasal drip, rhinorrhea, sinus pressure, sneezing, sore throat, tinnitus, trouble swallowing and voice change.   Eyes: Negative for photophobia, pain, discharge, redness, itching and visual disturbance.  Respiratory: Negative for apnea, cough, choking, chest tightness, shortness of breath, wheezing and stridor.   Cardiovascular: Negative for chest pain, palpitations and leg  swelling.  Gastrointestinal: Negative for abdominal distention, abdominal pain, anal bleeding, blood in stool, constipation, diarrhea, nausea, rectal pain and vomiting.  Endocrine: Negative for cold intolerance, heat intolerance, polydipsia, polyphagia and polyuria.  Genitourinary: Negative for decreased urine volume, difficulty urinating, dyspareunia, dysuria, enuresis, flank pain, frequency, genital sores, hematuria, menstrual problem, pelvic pain, urgency, vaginal bleeding, vaginal discharge and vaginal pain.  Musculoskeletal: Negative for arthralgias, back pain, gait problem, joint swelling, myalgias, neck pain and neck stiffness.  Skin: Negative for color change, pallor, rash and wound.  Allergic/Immunologic: Negative for environmental allergies, food allergies and immunocompromised state.  Neurological: Negative for dizziness, tremors, seizures, syncope, facial asymmetry, speech difficulty, weakness, light-headedness, numbness and headaches.  Hematological: Negative for adenopathy. Does not bruise/bleed easily.  Psychiatric/Behavioral: Positive for sleep disturbance. Negative for agitation, behavioral problems, confusion, decreased concentration, dysphoric mood, hallucinations, self-injury and suicidal ideas. The patient is not nervous/anxious and is not hyperactive.     Past Medical History:  Diagnosis Date  . Allergy    Claritin  . Blood transfusion without reported diagnosis 2001  . Fever blister    Past Surgical History:  Procedure Laterality Date  . ABDOMINAL HYSTERECTOMY     DUB.  ovaries intact.    Marland Kitchen BREAST SURGERY     implants B  . CESAREAN SECTION    . COSMETIC SURGERY     tummy tucks, bilateral breast implants  . HERNIA REPAIR  1969   Bilateral; age 85 year old.  . TUBAL LIGATION     Allergies  Allergen Reactions  . Atropine Hives  . Caffeine Other (See  Comments)    Shakes; hot/cold flashes;dizziness   Current Outpatient Prescriptions  Medication Sig Dispense  Refill  . loratadine (CLARITIN) 10 MG tablet Take 1 tablet (10 mg total) by mouth daily. 90 tablet 3  . valACYclovir (VALTREX) 1000 MG tablet TAKE TWO TABLETS BY MOUTH TWICE DAILY FOR 2 DOSES 30 tablet 3   No current facility-administered medications for this visit.    Social History   Social History  . Marital status: Single    Spouse name: n/a  . Number of children: 2  . Years of education: 12   Occupational History  . Supervisor Dispensing optician   Social History Main Topics  . Smoking status: Never Smoker  . Smokeless tobacco: Never Used  . Alcohol use Yes     Comment: occasionally  . Drug use: No  . Sexual activity: Yes    Partners: Male    Birth control/ protection: Surgical   Other Topics Concern  . Not on file   Social History Narrative   Marital status: married in 2018; second marriage      Children: 2 children (25, 75); no grandchildren      Lives: with husband      Employment: first Higher education careers adviser x 32 years      Alcohol: socially; not weekly.      Exercise: 2-3 days per week; one hour with personal trainer.    Family History  Problem Relation Age of Onset  . Cancer Mother 48    Non-Hodgkin's Lymphoma  . Alzheimer's disease Father   . Hyperlipidemia Father   . Heart disease Father        Objective:    BP 120/77   Pulse 85   Temp 98.2 F (36.8 C) (Oral)   Resp 16   Ht  (1.549 m)   Wt 113 lb (51.3 kg)   SpO2 96%   BMI 21.35 kg/m  Physical Exam  Constitutional: She is oriented to person, place, and time. She appears well-developed and well-nourished. No distress.  HENT:  Head: Normocephalic and atraumatic.  Right Ear: External ear normal.  Left Ear: External ear normal.  Nose: Nose normal.  Mouth/Throat: Oropharynx is clear and moist.  Eyes: Conjunctivae and EOM are normal. Pupils are equal, round, and reactive to light.  Neck: Normal range of motion and full passive range of motion without pain. Neck supple. No JVD  present. Carotid bruit is not present. No thyromegaly present.  Cardiovascular: Normal rate, regular rhythm and normal heart sounds.  Exam reveals no gallop and no friction rub.   No murmur heard. Pulmonary/Chest: Effort normal and breath sounds normal. She has no wheezes. She has no rales.  Abdominal: Soft. Bowel sounds are normal. She exhibits no distension and no mass. There is no tenderness. There is no rebound and no guarding.  Musculoskeletal:       Right shoulder: Normal.       Left shoulder: Normal.       Cervical back: Normal.  Lymphadenopathy:    She has no cervical adenopathy.  Neurological: She is alert and oriented to person, place, and time. She has normal reflexes. No cranial nerve deficit. She exhibits normal muscle tone. Coordination normal.  Skin: Skin is warm and dry. No rash noted. She is not diaphoretic. No erythema. No pallor.  Psychiatric: She has a normal mood and affect. Her behavior is normal. Judgment and thought content normal.  Nursing note and vitals reviewed.   Depression screen [facility] 2/9  02/13/2017 01/11/2017 03/09/2016 04/21/2015 07/28/2014  Decreased Interest 0 0 0 0 0  Down, Depressed, Hopeless 0 0 0 0 0  PHQ - 2 Score 0 0 0 0 0   Fall Risk  02/13/2017 01/11/2017 03/09/2016 07/28/2014  Falls in the past year? No No No No       Assessment & Plan:   1. Routine physical examination   2. Colon cancer screening   3. Primary insomnia   4. Chronic seasonal allergic rhinitis due to pollen   5. Screening for diabetes mellitus   6. Screening, lipid   7. Cold intolerance   8. Screening for cardiovascular condition   9. Fever blister    -anticipatory guidance -- ongoing exercise and weight maintenance. -refer for colonoscopy. -obtain age appropriate screening labs. -ongoing insomnia; pt intolerant to many medications; pt desires sleep study to rule out secondary causes. -refills provided for allergic rhinitis and HSV labialis. -suffers with cold intolerance; obtain  labs.    Orders Placed This Encounter  Procedures  . CBC with Differential/Platelet  . Comprehensive metabolic panel    Order Specific Question:   Has the patient fasted?    Answer:   Yes  . Hemoglobin A1c  . Lipid panel    Order Specific Question:   Has the patient fasted?    Answer:   Yes  . TSH  . Ambulatory referral to Gastroenterology    Referral Priority:   Routine    Referral Type:   Consultation    Referral Reason:   Specialty Services Required    Number of Visits Requested:   1  . Ambulatory referral to Sleep Studies    Referral Priority:   Routine    Referral Type:   Consultation    Referral Reason:   Specialty Services Required    Number of Visits Requested:   1  . POCT urinalysis dipstick  . EKG 12-Lead   Meds ordered this encounter  Medications  . loratadine (CLARITIN) 10 MG tablet    Sig: Take 1 tablet (10 mg total) by mouth daily.    Dispense:  90 tablet    Refill:  3  . valACYclovir (VALTREX) 1000 MG tablet    Sig: TAKE TWO TABLETS BY MOUTH TWICE DAILY FOR 2 DOSES    Dispense:  30 tablet    Refill:  3    Return in about 1 year (around 02/13/2018) for complete physical examiniation.   Montrez Marietta Paulita Fujita, M.D. Primary Care at [facility] previously [facility] [address] [place], [place]  [address] [phone] phone [phone] fax

## 2017-02-13 ENCOUNTER — Ambulatory Visit (INDEPENDENT_AMBULATORY_CARE_PROVIDER_SITE_OTHER): Payer: BLUE CROSS/BLUE SHIELD | Admitting: Family Medicine

## 2017-02-13 ENCOUNTER — Encounter: Payer: Self-pay | Admitting: Family Medicine

## 2017-02-13 VITALS — BP 120/77 | HR 85 | Temp 98.2°F | Resp 16 | Ht 61.0 in | Wt 113.0 lb

## 2017-02-13 DIAGNOSIS — R6889 Other general symptoms and signs: Secondary | ICD-10-CM

## 2017-02-13 DIAGNOSIS — Z0000 Encounter for general adult medical examination without abnormal findings: Secondary | ICD-10-CM | POA: Diagnosis not present

## 2017-02-13 DIAGNOSIS — Z136 Encounter for screening for cardiovascular disorders: Secondary | ICD-10-CM | POA: Diagnosis not present

## 2017-02-13 DIAGNOSIS — J301 Allergic rhinitis due to pollen: Secondary | ICD-10-CM | POA: Diagnosis not present

## 2017-02-13 DIAGNOSIS — Z1211 Encounter for screening for malignant neoplasm of colon: Secondary | ICD-10-CM | POA: Diagnosis not present

## 2017-02-13 DIAGNOSIS — B001 Herpesviral vesicular dermatitis: Secondary | ICD-10-CM

## 2017-02-13 DIAGNOSIS — Z13220 Encounter for screening for lipoid disorders: Secondary | ICD-10-CM | POA: Diagnosis not present

## 2017-02-13 DIAGNOSIS — Z131 Encounter for screening for diabetes mellitus: Secondary | ICD-10-CM

## 2017-02-13 DIAGNOSIS — F5101 Primary insomnia: Secondary | ICD-10-CM | POA: Diagnosis not present

## 2017-02-13 LAB — POCT URINALYSIS DIP (MANUAL ENTRY)
BILIRUBIN UA: NEGATIVE
Bilirubin, UA: NEGATIVE
GLUCOSE UA: NEGATIVE
LEUKOCYTES UA: NEGATIVE
Nitrite, UA: NEGATIVE
Protein Ur, POC: NEGATIVE
Spec Grav, UA: 1.015 (ref 1.030–1.035)
Urobilinogen, UA: 0.2 (ref ?–2.0)
pH, UA: 5 (ref 5.0–8.0)

## 2017-02-13 MED ORDER — LORATADINE 10 MG PO TABS: 10.0000 mg | ORAL_TABLET | Freq: Every day | ORAL | 3 refills | Status: DC

## 2017-02-13 MED ORDER — VALACYCLOVIR HCL 1 G PO TABS: ORAL_TABLET | ORAL | 3 refills | Status: DC

## 2017-02-13 NOTE — Patient Instructions (Addendum)
IF you received an x-ray today, you will receive an invoice from [facility]. Please contact [facility] at [phone] with questions or concerns regarding your invoice.   IF you received labwork today, you will receive an invoice from [facility]. Please contact [facility] at [phone] with questions or concerns regarding your invoice.   Our billing staff will not be able to assist you with questions regarding bills from these companies.  You will be contacted with the lab results as soon as they are available. The fastest way to get your results is to activate your My Chart account. Instructions are located on the last page of this paperwork. If you have not heard from [place] regarding the results in 2 weeks, please contact this office.     pr Preventive Care 40-64 Years, Female Preventive care refers to lifestyle choices and visits with your health care provider that can promote health and wellness. What does preventive care include?  A yearly physical exam. This is also called an annual well check.  Dental exams once or twice a year.  Routine eye exams. Ask your health care provider how often you should have your eyes checked.  Personal lifestyle choices, including:  Daily care of your teeth and gums.  Regular physical activity.  Eating a healthy diet.  Avoiding tobacco and drug use.  Limiting alcohol use.  Practicing safe sex.  Taking low-dose aspirin daily starting at age 104.  Taking vitamin and mineral supplements as recommended by your health care provider. What happens during an annual well check? The services and screenings done by your health care provider during your annual well check will depend on your age, overall health, lifestyle risk factors, and family history of disease. Counseling  Your health care provider may ask you questions about your:  Alcohol use.  Tobacco use.  Drug use.  Emotional well-being.  Home and relationship  well-being.  Sexual activity.  Eating habits.  Work and work Statistician.  Method of birth control.  Menstrual cycle.  Pregnancy history. Screening  You may have the following tests or measurements:  Height, weight, and BMI.  Blood pressure.  Lipid and cholesterol levels. These may be checked every 5 years, or more frequently if you are over 67 years old.  Skin check.  Lung cancer screening. You may have this screening every year starting at age 63 if you have a 30-pack-year history of smoking and currently smoke or have quit within the past 15 years.  Fecal occult blood test (FOBT) of the stool. You may have this test every year starting at age 46.  Flexible sigmoidoscopy or colonoscopy. You may have a sigmoidoscopy every 5 years or a colonoscopy every 10 years starting at age 17.  Hepatitis C blood test.  Hepatitis B blood test.  Sexually transmitted disease (STD) testing.  Diabetes screening. This is done by checking your blood sugar (glucose) after you have not eaten for a while (fasting). You may have this done every 1-3 years.  Mammogram. This may be done every 1-2 years. Talk to your health care provider about when you should start having regular mammograms. This may depend on whether you have a family history of breast cancer.  BRCA-related cancer screening. This may be done if you have a family history of breast, ovarian, tubal, or peritoneal cancers.  Pelvic exam and Pap test. This may be done every 3 years starting at age 56. Starting at age 76, this may be done every 5 years  if you have a Pap test in combination with an HPV test.  Bone density scan. This is done to screen for osteoporosis. You may have this scan if you are at high risk for osteoporosis. Discuss your test results, treatment options, and if necessary, the need for more tests with your health care provider. Vaccines  Your health care provider may recommend certain vaccines, such  as:  Influenza vaccine. This is recommended every year.  Tetanus, diphtheria, and acellular pertussis (Tdap, Td) vaccine. You may need a Td booster every 10 years.  Varicella vaccine. You may need this if you have not been vaccinated.  Zoster vaccine. You may need this after age 75.  Measles, mumps, and rubella (MMR) vaccine. You may need at least one dose of MMR if you were born in 1957 or later. You may also need a second dose.  Pneumococcal 13-valent conjugate (PCV13) vaccine. You may need this if you have certain conditions and were not previously vaccinated.  Pneumococcal polysaccharide (PPSV23) vaccine. You may need one or two doses if you smoke cigarettes or if you have certain conditions.  Meningococcal vaccine. You may need this if you have certain conditions.  Hepatitis A vaccine. You may need this if you have certain conditions or if you travel or work in places where you may be exposed to hepatitis A.  Hepatitis B vaccine. You may need this if you have certain conditions or if you travel or work in places where you may be exposed to hepatitis B.  Haemophilus influenzae type b (Hib) vaccine. You may need this if you have certain conditions. Talk to your health care provider about which screenings and vaccines you need and how often you need them. This information is not intended to replace advice given to you by your health care provider. Make sure you discuss any questions you have with your health care provider. Document Released: 11/26/2015 Document Revised: 07/19/2016 Document Reviewed: 08/31/2015 Elsevier Interactive Patient Education  2017 [employer].

## 2017-02-14 LAB — COMPREHENSIVE METABOLIC PANEL
ALBUMIN: 4.3 g/dL (ref 3.5–5.5)
ALT: 18 IU/L (ref 0–32)
AST: 27 IU/L (ref 0–40)
Albumin/Globulin Ratio: 1.8 (ref 1.2–2.2)
Alkaline Phosphatase: 53 IU/L (ref 39–117)
BUN/Creatinine Ratio: 21 (ref 9–23)
BUN: 17 mg/dL (ref 6–24)
Bilirubin Total: 0.4 mg/dL (ref 0.0–1.2)
CALCIUM: 9 mg/dL (ref 8.7–10.2)
CO2: 23 mmol/L (ref 18–29)
CREATININE: 0.8 mg/dL (ref 0.57–1.00)
Chloride: 102 mmol/L (ref 96–106)
GFR, EST AFRICAN AMERICAN: 97 mL/min/{1.73_m2} (ref >59)
GFR, EST NON AFRICAN AMERICAN: 84 mL/min/{1.73_m2} (ref >59)
GLOBULIN, TOTAL: 2.4 g/dL (ref 1.5–4.5)
Glucose: 80 mg/dL (ref 65–99)
POTASSIUM: 4.2 mmol/L (ref 3.5–5.2)
SODIUM: 141 mmol/L (ref 134–144)
Total Protein: 6.7 g/dL (ref 6.0–8.5)

## 2017-02-14 LAB — CBC WITH DIFFERENTIAL/PLATELET
BASOS: 1 %
Basophils Absolute: 0 10*3/uL (ref 0.0–0.2)
EOS (ABSOLUTE): 0.1 10*3/uL (ref 0.0–0.4)
EOS: 4 %
HEMATOCRIT: 40.4 % (ref 34.0–46.6)
Hemoglobin: 12.7 g/dL (ref 11.1–15.9)
IMMATURE GRANULOCYTES: 0 %
Immature Grans (Abs): 0 10*3/uL (ref 0.0–0.1)
LYMPHS ABS: 1.2 10*3/uL (ref 0.7–3.1)
Lymphs: 34 %
MCH: 28.5 pg (ref 26.6–33.0)
MCHC: 31.4 g/dL — AB (ref 31.5–35.7)
MCV: 91 fL (ref 79–97)
MONOCYTES: 13 %
MONOS ABS: 0.5 10*3/uL (ref 0.1–0.9)
Neutrophils Absolute: 1.7 10*3/uL (ref 1.4–7.0)
Neutrophils: 48 %
Platelets: 187 10*3/uL (ref 150–379)
RBC: 4.46 x10E6/uL (ref 3.77–5.28)
RDW: 14.2 % (ref 12.3–15.4)
WBC: 3.6 10*3/uL (ref 3.4–10.8)

## 2017-02-14 LAB — LIPID PANEL
CHOL/HDL RATIO: 2.4 ratio (ref 0.0–4.4)
Cholesterol, Total: 160 mg/dL (ref 100–199)
HDL: 66 mg/dL (ref >39)
LDL CALC: 86 mg/dL (ref 0–99)
Triglycerides: 40 mg/dL (ref 0–149)
VLDL CHOLESTEROL CAL: 8 mg/dL (ref 5–40)

## 2017-02-14 LAB — TSH: TSH: 1.99 u[IU]/mL (ref 0.450–4.500)

## 2017-02-14 LAB — HEMOGLOBIN A1C
Est. average glucose Bld gHb Est-mCnc: 105 mg/dL
Hgb A1c MFr Bld: 5.3 % (ref 4.8–5.6)

## 2017-02-19 ENCOUNTER — Telehealth: Payer: Self-pay | Admitting: Family Medicine

## 2017-02-19 NOTE — Telephone Encounter (Signed)
Please see results and advise.

## 2017-02-19 NOTE — Telephone Encounter (Signed)
Pt calling about labs and EKG she states that if we could document the results to where she can understand results of both

## 2017-02-20 NOTE — Telephone Encounter (Signed)
Labs all normal; commented on normal labs and released via MyChart.  EKG is normal; urine is normal.

## 2017-02-21 NOTE — Telephone Encounter (Signed)
Pt advised l/m 

## 2017-03-11 DIAGNOSIS — F5101 Primary insomnia: Secondary | ICD-10-CM | POA: Insufficient documentation

## 2017-03-11 DIAGNOSIS — J301 Allergic rhinitis due to pollen: Secondary | ICD-10-CM | POA: Insufficient documentation

## 2017-03-22 ENCOUNTER — Encounter: Payer: Self-pay | Admitting: Neurology

## 2017-03-22 ENCOUNTER — Ambulatory Visit (INDEPENDENT_AMBULATORY_CARE_PROVIDER_SITE_OTHER): Payer: BLUE CROSS/BLUE SHIELD | Admitting: Neurology

## 2017-03-22 VITALS — BP 118/66 | HR 76 | Resp 14 | Ht 61.0 in | Wt 115.0 lb

## 2017-03-22 DIAGNOSIS — R351 Nocturia: Secondary | ICD-10-CM

## 2017-03-22 DIAGNOSIS — G479 Sleep disorder, unspecified: Secondary | ICD-10-CM | POA: Diagnosis not present

## 2017-03-22 DIAGNOSIS — G2581 Restless legs syndrome: Secondary | ICD-10-CM | POA: Diagnosis not present

## 2017-03-22 DIAGNOSIS — G4761 Periodic limb movement disorder: Secondary | ICD-10-CM

## 2017-03-22 DIAGNOSIS — R0683 Snoring: Secondary | ICD-10-CM

## 2017-03-22 NOTE — Progress Notes (Signed)
Subjective:    Patient ID: Monique Stephens is a 51 y.o. female.  HPI     Huston Foley, MD, PhD [facility] [address], [address] [address] [place], [place] [address]  Dear Dr. Katrinka Blazing,   I saw your patient, Monique Stephens, upon your kind request in my neurologic clinic today for initial consultation of her sleep disorder. The patient is unaccompanied today. As you know, Ms. Monique Stephens is a 78 year old right-handed woman with an underlying medical history of allergies, who reports snoring and sleep disruption. I reviewed your office note from 02/13/2017. She has difficulty falling asleep and maintaining sleep. She is married and lives with her husband. She has 2 children, both grown. She is a nonsmoker and drinks alcohol occasionally. She does not drink caffeine very much secondary to allergy. She usually drinks a cup of coffee in the morning and occasional soda. Her Epworth sleepiness score is 3 out of 24, fatigue score is 10 out of 63. For her sleep onset and sleep maintenance difficulties she has tried over-the-counter medications including melatonin and p.m. type medications which did not help. She reports being sensitive to medications and tries to avoid taking medications even over-the-counter. She has tried Ambien in the past for a few weeks which cause side effects and did not help. She did not try any other prescription sleep aid. She has been told that she snores per husband. She is a very restless sleeper. She has tried changing her sleep environment including different types of mattresses. She is also uncomfortable in her legs at night, she reports discomfort in her hip and knee areas, no pain, typically no arthritis symptoms, and need to move her legs and shift her feet. She has been told by her husband that she moves her legs in her sleep and often wakes up in the very different position. She has nocturia multiple times per night, between 3-6 times per hour tonight.  She works out regularly, does not exercise close to bedtime typically. She works out with a Psychologist, educational 3 days a week. She denies morning headaches. She is not aware of any family history of OSA but she recalls that her father slept little, averaging about 4 hours per night. She has had sleep problems for years. She has noted that when she sleeps with a heat pad or warm blanket she sleeps better. Bedtime is between 10 and 11. She does not watch TV in bed. Wake up time for work usually at 6.  Her Past Medical History Is Significant For: Past Medical History:  Diagnosis Date  . Allergy    Claritin  . Blood transfusion without reported diagnosis 2001  . Fever blister     Her Past Surgical History Is Significant For: Past Surgical History:  Procedure Laterality Date  . ABDOMINAL HYSTERECTOMY     DUB.  ovaries intact.    Marland Kitchen BREAST SURGERY     implants B  . CESAREAN SECTION    . COSMETIC SURGERY     tummy tucks, bilateral breast implants  . HERNIA REPAIR  1969   Bilateral; age 54 year old.  . TUBAL LIGATION      Her Family History Is Significant For: Family History  Problem Relation Age of Onset  . Cancer Mother 60       Non-Hodgkin's Lymphoma  . Alzheimer's disease Father   . Hyperlipidemia Father   . Heart disease Father     Her Social History Is Significant For: Social History  Social History  . Marital status: Married    Spouse name: n/a  . Number of children: 2  . Years of education: 12   Occupational History  . 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 Smoking status: Never Smoker  . Smokeless tobacco: Never Used  . Alcohol use Yes     Comment: occasionally  . Drug use: No  . Sexual activity: Yes    Partners: Male    Birth control/ protection: Surgical   Other Topics Concern  . None   Social History Narrative   Marital status: married in 2018; second marriage      Children: 2 children (25, 4421); no grandchildren      Lives: with  husband      Employment: first Higher education careers advisercitizen supervisor x 32 years      Alcohol: socially; not weekly.      Exercise: 2-3 days per week; one hour with personal trainer.    Drinks 1 cup of coffee 5 days a week, occasional soda     Her Allergies Are:  Allergies  Allergen Reactions  . Atropine Hives  . Caffeine Other (See Comments)    Shakes; hot/cold flashes;dizziness  . Morphine And Related Itching  :   Her Current Medications Are:  Outpatient Encounter Prescriptions as of 03/22/2017  Medication Sig  . loratadine (CLARITIN) 10 MG tablet Take 1 tablet (10 mg total) by mouth daily.  . [DISCONTINUED] valACYclovir (VALTREX) 1000 MG tablet TAKE TWO TABLETS BY MOUTH TWICE DAILY FOR 2 DOSES   No facility-administered encounter medications on file as of 03/22/2017.   : Review of Systems:  Out of a complete 14 point review of systems, all are reviewed and negative with the exception of these symptoms as listed below:  Review of Systems  Neurological:       Patient has trouble falling and staying asleep, snores, denies taking naps.    Epworth Sleepiness Scale 0= would never doze 1= slight chance of dozing 2= moderate chance of dozing 3= high chance of dozing  Sitting and reading:0 Watching TV:0 Sitting inactive in a public place (ex. Theater or meeting):0As a passenger in a car for an hour without a break:3 Lying down to rest in the afternoon:0 Sitting and talking to someone:0 Sitting quietly after lunch (no alcohol):0 In a car, while stopped in traffic:0 Total:3  Objective:  Neurologic Exam  Physical Exam Physical Examination:   Vitals:   03/22/17 1112  BP: 118/66  Pulse: 76  Resp: 14    General Examination: The patient is a very pleasant 51 y.o. female in no acute distress. She appears well-developed and well-nourished and well groomed.   HEENT: Normocephalic, atraumatic, pupils are equal, round and reactive to light and accommodation. Corrective eye glasses. Extraocular  tracking is good without limitation to gaze excursion or nystagmus noted. Normal smooth pursuit is noted. Hearing is grossly intact. Face is symmetric with normal facial animation and normal facial sensation. Speech is clear with no dysarthria noted. There is no hypophonia. There is no lip, neck/head, jaw or voice tremor. Neck is supple with full range of passive and active motion. There are no carotid bruits on auscultation. Oropharynx exam reveals: mild mouth dryness, adequate dental hygiene and moderate airway crowding, due to small airway and larger uvula, wider. Mallampati is class III. Tongue protrudes centrally and palate elevates symmetrically. Tonsils are 1+ in size. Neck size is 12 inches. She has a Mild overbite.   Chest: Clear  to auscultation without wheezing, rhonchi or crackles noted.  Heart: S1+S2+0, regular and normal without murmurs, rubs or gallops noted.   Abdomen: Soft, non-tender and non-distended with normal bowel sounds appreciated on auscultation.  Extremities: There is no pitting edema in the distal lower extremities bilaterally. Pedal pulses are intact.  Skin: Warm and dry without trophic changes noted.   Musculoskeletal: exam reveals no obvious joint deformities, tenderness or joint swelling or erythema.   Neurologically:  Mental status: The patient is awake, alert and oriented in all 4 spheres. Her immediate and remote memory, attention, language skills and fund of knowledge are appropriate. There is no evidence of aphasia, agnosia, apraxia or anomia. Speech is clear with normal prosody and enunciation. Thought process is linear. Mood is normal and affect is normal.  Cranial nerves II - XII are as described above under HEENT exam. In addition: shoulder shrug is normal with equal shoulder height noted. Motor exam: Normal bulk, strength and tone is noted. There is no drift, tremor or rebound. Romberg is negative. Reflexes are 2+ throughout. Fine motor skills and  coordination: intact with normal finger taps, normal hand movements, normal rapid alternating patting, normal foot taps and normal foot agility.  Cerebellar testing: No dysmetria or intention tremor on finger to nose testing. Heel to shin is unremarkable bilaterally. There is no truncal or gait ataxia.  Sensory exam: intact to light touch in the upper and lower extremities.  Gait, station and balance: She stands easily. No veering to one side is noted. No leaning to one side is noted. Posture is age-appropriate and stance is narrow based. Gait shows normal stride length and normal pace. No problems turning are noted. Tandem walk is unremarkable.               Assessment and plan:  In summary, Samarie Pinder is a very pleasant 51 y.o.-year old female with an underlying medical history of allergies, who presents for initial evan of her sleep disorder including restless sleep, nocturia, sleep initiation and maintenance problems, as well as snoring reported. While she is slender with a smaller neck circumference, she does have a crowded appearing airway and in light of her sleep disruption, nocturia and snoring reported, there is risk for underlying obstructive sleep apnea. In addition, she endorses restless leg syndrome type symptoms and leg movements in her sleep.  I had a long chat with the patient about my findings and the diagnosis of OSA, its prognosis and treatment options. We talked about medical treatments, surgical interventions and non-pharmacological approaches. I explained in particular the risks and ramifications of untreated moderate to severe OSA, especially with respect to developing cardiovascular disease down the Road, including congestive heart failure, difficult to treat hypertension, cardiac arrhythmias, or stroke. Even type 2 diabetes has, in part, been linked to untreated OSA. Symptoms of untreated OSA include daytime sleepiness, memory problems, mood irritability and mood disorder such  as depression and anxiety, lack of energy, as well as recurrent headaches, especially morning headaches. We talked about trying to maintain a healthy lifestyle in general, as well as the importance of good sleep hygiene.  I recommended the following at this time: sleep study with potential positive airway pressure titration. (We will score hypopneas at 3%). We will also be on the lookout for PLMs.    I explained the sleep test procedure to the patient. I answered all her questions today and the patient was in agreement. I would like to see her back after the sleep  study is completed and encouraged her to call with any interim questions, concerns, problems or updates.   Thank you very much for allowing me to participate in the care of this nice patient. If I can be of any further assistance to you please do not hesitate to call me at [phone].  Sincerely,   Star Age, MD, PhD

## 2017-03-22 NOTE — Patient Instructions (Signed)

## 2017-04-03 ENCOUNTER — Ambulatory Visit (INDEPENDENT_AMBULATORY_CARE_PROVIDER_SITE_OTHER): Payer: BLUE CROSS/BLUE SHIELD | Admitting: Neurology

## 2017-04-03 DIAGNOSIS — G4720 Circadian rhythm sleep disorder, unspecified type: Secondary | ICD-10-CM

## 2017-04-03 DIAGNOSIS — G4733 Obstructive sleep apnea (adult) (pediatric): Secondary | ICD-10-CM | POA: Diagnosis not present

## 2017-04-06 DIAGNOSIS — R351 Nocturia: Secondary | ICD-10-CM | POA: Insufficient documentation

## 2017-04-06 NOTE — Procedures (Signed)
PATIENT'S NAME:  Monique Stephens, Monique Stephens DOB:      01-18-1966      MR#:    [id number]     DATE OF RECORDING: 04/03/2017 REFERRING M.D.:  Nilda Simmer, MD Study Performed:   Baseline Polysomnogram HISTORY: 51 year old woman with a history of allergies, who reports snoring and sleep disruption. She has difficulty falling asleep and maintaining sleep. She reports snoring and restless sleep. The patient endorsed the Epworth Sleepiness Scale at 3/24 points. The patient's weight 115 pounds with a height of 61 (inches), resulting in a BMI of 21.6 kg/m2. The patient's neck circumference measured 12 inches.  CURRENT MEDICATIONS: Loratadine   PROCEDURE:  This is a multichannel digital polysomnogram utilizing the Somnostar 11.2 system.  Electrodes and sensors were applied and monitored per AASM Specifications.   EEG, EOG, Chin and Limb EMG, were sampled at 200 Hz.  ECG, Snore and Nasal Pressure, Thermal Airflow, Respiratory Effort, CPAP Flow and Pressure, Oximetry was sampled at 50 Hz. Digital video and audio were recorded.      BASELINE STUDY  Lights Out was at 22:24 and Lights On at 05:11.  Total recording time (TRT) was 407.5 minutes, with a total sleep time (TST) of  295.5 minutes.   The patient's sleep latency was 51.5 minutes, which is delayed.  REM latency was 150.5 minutes, which is delayed.  The sleep efficiency was 72.5%.     SLEEP ARCHITECTURE: WASO (Wake after sleep onset) was 76.5 minutes with mild sleep fragmentation noted.  There were 14 minutes in Stage N1, 194.5 minutes Stage N2, 44.5 minutes Stage N3 and 42.5 minutes in Stage REM.  The percentage of Stage N1 was 4.7%, Stage N2 was 65.8%, which is increased, Stage N3 was 15.1%, which is normal, and Stage R (REM sleep) was 14.4%, which is decreased.  The arousals were noted as: 39 were spontaneous, 0 were associated with PLMs, 16 were associated with respiratory events.    Audio and video analysis did not show any abnormal or unusual movements,  behaviors, phonations or vocalizations.  The patient took bathroom breaks. Mild intermittent snoring was noted. The EKG was in keeping with normal sinus rhythm (NSR).  RESPIRATORY ANALYSIS:  There were a total of 16 respiratory events:  1 obstructive apneas, 0 central apneas and 0 mixed apneas with a total of 1 apneas and an apnea index (AI) of .2 /hour. There were 15 hypopneas with a hypopnea index of 3. /hour. The patient also had 0 respiratory event related arousals (RERAs).      The total APNEA/HYPOPNEA INDEX (AHI) was 3.2/hour and the total RESPIRATORY DISTURBANCE INDEX was 3.2 /hour.  7 events occurred in REM sleep and 18 events in NREM. The REM AHI was 9.9 /hour, versus a non-REM AHI of 2.1. The patient spent 295.5 minutes of total sleep time in the supine position and 0 minutes in non-supine.. The supine AHI was 3.2 versus a non-supine AHI of 0.0.  OXYGEN SATURATION & C02:  The Wake baseline 02 saturation was 98%, with the lowest being 90%. Time spent below 89% saturation equaled 0 minutes.  PERIODIC LIMB MOVEMENTS:  The patient had a total of 0 Periodic Limb Movements.  The Periodic Limb Movement (PLM) index was 0 and the PLM Arousal index was 0/hour.  Post-study, the patient indicated that sleep was the same as usual.   IMPRESSION:  1. Dysfunctions associated with sleep stages or arousal from sleep  RECOMMENDATIONS:  1. This study does not demonstrate any significant obstructive or  central sleep disordered breathing with the exception of mild intermittent snoring and mild REM related OSA with no desaturations below 90% for the night. This study does not support an intrinsic sleep disorder as a cause of the patient's symptoms. Other causes, including circadian rhythm disturbances, an underlying mood disorder, medication effect and/or an underlying medical problem cannot be ruled out. 2. This study shows sleep fragmentation and abnormal sleep stage percentages; these are nonspecific  findings and per se do not signify an intrinsic sleep disorder or a cause for the patient's sleep-related symptoms. Causes include (but are not limited to) the first night effect of the sleep study, circadian rhythm disturbances, medication effect or an underlying mood disorder or medical problem.  3. The patient should be cautioned not to drive, work at heights, or operate dangerous or heavy equipment when tired or sleepy. Review and reiteration of good sleep hygiene measures should be pursued with any patient. She will be advised to consider sleep psychological consultation for her sleep initiation and maintenance problems; she may benefit from cognitive bahavioral therapy (CBT-I). The patient can follow-up with her referring provider, who will be notified of the test results.  I certify that I have reviewed the entire raw data recording prior to the issuance of this report in accordance with the Standards of Accreditation of the American Academy of Sleep Medicine (AASM)   Huston FoleySaima Sharman Garrott, MD, PhD Diplomat, American Board of Psychiatry and Neurology (Neurology and Sleep Medicine)

## 2017-04-06 NOTE — Progress Notes (Signed)
Patient referred by Dr. Katrinka BlazingSmith, seen by me on 03/22/17, diagnostic PSG on 04/03/17.   Please call and notify the patient that the recent sleep study did not show any significant obstructive sleep apnea with the exception of mild intermittent snoring and mild REM related OSA with no desaturations below 90% for the night.  Please remind patient to try to maintain good sleep hygiene, which means: Keep a regular sleep and wake schedule, try not to exercise or have a meal within 2 hours of your bedtime, try to keep your bedroom conducive for sleep, that is, cool and dark, without light distractors such as an illuminated alarm clock, and refrain from watching TV right before sleep or in the middle of the night and do not keep the TV or radio on during the night. Also, try not to use or play on electronic devices at bedtime, such as your cell phone, tablet PC or laptop. If you like to read at bedtime on an electronic device, try to dim the background light as much as possible. Do not eat in the middle of the night.   She may want to discuss with PCP sleep psychological consultation for her sleep initiation and maintenance problems; she may benefit from cognitive bahavioral therapy (CBT-I). The patient can follow-up with her referring provider at this point.  Also, route or fax report to PCP and referring MD, if other than PCP.  Once you have spoken to patient, you can close this encounter.   Thanks,  Huston FoleySaima Inella Kuwahara, MD, PhD [facility] ([facility])\

## 2017-04-10 ENCOUNTER — Telehealth: Payer: Self-pay

## 2017-04-10 NOTE — Telephone Encounter (Signed)
LM for patient to call back for results

## 2017-04-10 NOTE — Telephone Encounter (Signed)
-----   Message from Huston FoleySaima Athar, MD sent at 04/06/2017  1:57 PM EDT ----- Patient referred by Dr. Katrinka BlazingSmith, seen by me on 03/22/17, diagnostic PSG on 04/03/17.   Please call and notify the patient that the recent sleep study did not show any significant obstructive sleep apnea with the exception of mild intermittent snoring and mild REM related OSA with no desaturations below 90% for the night.  Please remind patient to try to maintain good sleep hygiene, which means: Keep a regular sleep and wake schedule, try not to exercise or have a meal within 2 hours of your bedtime, try to keep your bedroom conducive for sleep, that is, cool and dark, without light distractors such as an illuminated alarm clock, and refrain from watching TV right before sleep or in the middle of the night and do not keep the TV or radio on during the night. Also, try not to use or play on electronic devices at bedtime, such as your cell phone, tablet PC or laptop. If you like to read at bedtime on an electronic device, try to dim the background light as much as possible. Do not eat in the middle of the night.   She may want to discuss with PCP sleep psychological consultation for her sleep initiation and maintenance problems; she may benefit from cognitive bahavioral therapy (CBT-I). The patient can follow-up with her referring provider at this point.  Also, route or fax report to PCP and referring MD, if other than PCP.  Once you have spoken to patient, you can close this encounter.   Thanks,  Huston FoleySaima Athar, MD, PhD [facility] ([facility]

## 2017-04-10 NOTE — Telephone Encounter (Signed)
Again, she was not noted to have significant or loud snoring during the sleep study. If she has other symptoms such as recurrent sinus infection, nasal congestion, more consistent or louder snoring at home, she may benefit from seeing an ENT for consultation. Please relay back to pt.

## 2017-04-10 NOTE — Telephone Encounter (Signed)
Dr. Frances FurbishAthar- Patient is aware of results below. She asks if getting a tonsillectomy would be beneficial to her? Patient recalls that you observed that she has a small airway.   I spoke to patient and she is aware of results and recommendations. She voiced understanding. I will send copy to PCP. Patient asked for a copy of report to be mailed to her, I confirmed her address.

## 2017-04-11 NOTE — Telephone Encounter (Signed)
LM (per patient's verbal request yesterday) with recommendations below. Left call back number for any further questions.

## 2017-04-12 NOTE — Telephone Encounter (Signed)
I called pt. She did not want to go into detail regarding which questions she has. She is finishing something at work and asked that I call her on Monday.

## 2017-04-12 NOTE — Telephone Encounter (Signed)
Patient called office requesting a call back from RN due to having further questions.  Please call [phone]

## 2017-04-12 NOTE — Telephone Encounter (Signed)
Patient called office returning RN's call.  Please call °

## 2017-04-12 NOTE — Telephone Encounter (Signed)
LM for patient to call back.

## 2017-04-16 NOTE — Telephone Encounter (Signed)
I called pt's work number, but the office is not open yet. I called pt's cell phone and left her a message asking her to call me back.

## 2017-04-16 NOTE — Telephone Encounter (Signed)
I called pt's work number again, pt's extension is busy. I left another message on pt's cell phone asking her to call me back.

## 2017-04-16 NOTE — Telephone Encounter (Signed)
Pt returned my call. She wants to know details regarding her sleep study, such as the time spent in each stage of sleep, when she moved around at night and what stage it was, and the times she was in each stage of sleep. I read to her the sleep architecture part from her sleep study on 04/03/2017. Pt has a multitude of questions about the numbers in her sleep study. I offered to make her an appt with Dr. Frances FurbishAthar to discuss this in further detail, but pt declined. She just wants all of these questions answered via phone. Pt is asking that I mail her a copy of these results. I verified with the pt that the address we have on file is correct.  Pt is asking that Zella BallRobin call her at [phone] or [phone] to discuss the technical data of her study further.

## 2017-08-20 DIAGNOSIS — Z130 Encounter for screening for diseases of the blood and blood-forming organs and certain disorders involving the immune mechanism: Secondary | ICD-10-CM | POA: Diagnosis not present

## 2017-08-20 DIAGNOSIS — Z01419 Encounter for gynecological examination (general) (routine) without abnormal findings: Secondary | ICD-10-CM | POA: Diagnosis not present

## 2017-08-20 DIAGNOSIS — Z1389 Encounter for screening for other disorder: Secondary | ICD-10-CM | POA: Diagnosis not present

## 2017-08-20 DIAGNOSIS — H0015 Chalazion left lower eyelid: Secondary | ICD-10-CM | POA: Diagnosis not present

## 2017-08-20 DIAGNOSIS — H40011 Open angle with borderline findings, low risk, right eye: Secondary | ICD-10-CM | POA: Diagnosis not present

## 2017-08-20 DIAGNOSIS — H40012 Open angle with borderline findings, low risk, left eye: Secondary | ICD-10-CM | POA: Diagnosis not present

## 2017-08-20 DIAGNOSIS — Z6820 Body mass index (BMI) 20.0-20.9, adult: Secondary | ICD-10-CM | POA: Diagnosis not present

## 2017-08-20 DIAGNOSIS — H40013 Open angle with borderline findings, low risk, bilateral: Secondary | ICD-10-CM | POA: Diagnosis not present

## 2017-08-20 DIAGNOSIS — H52211 Irregular astigmatism, right eye: Secondary | ICD-10-CM | POA: Diagnosis not present

## 2017-08-20 DIAGNOSIS — H52222 Regular astigmatism, left eye: Secondary | ICD-10-CM | POA: Diagnosis not present

## 2017-08-21 ENCOUNTER — Ambulatory Visit (INDEPENDENT_AMBULATORY_CARE_PROVIDER_SITE_OTHER): Payer: BLUE CROSS/BLUE SHIELD | Admitting: Family Medicine

## 2017-08-21 ENCOUNTER — Encounter: Payer: Self-pay | Admitting: Family Medicine

## 2017-08-21 VITALS — BP 116/72 | HR 83 | Temp 98.8°F | Resp 17 | Ht 61.0 in | Wt 107.6 lb

## 2017-08-21 DIAGNOSIS — H6983 Other specified disorders of Eustachian tube, bilateral: Secondary | ICD-10-CM

## 2017-08-21 MED ORDER — FLUTICASONE PROPIONATE 50 MCG/ACT NA SUSP: 2.0000 | Freq: Every day | NASAL | 6 refills | Status: DC

## 2017-08-21 NOTE — Patient Instructions (Addendum)

## 2017-08-21 NOTE — Progress Notes (Signed)
10/9/201810:15 PM  Classie Weng 06-07-1966, 51 y.o. female [id number]  Chief Complaint  Patient presents with  . Sinusitis    onset: intermittent x month becoming more consistent, pressure across cheeks and back toward ears, more frequent ha's    HPI:   Patient is a 51 y.o. female who presents today for worsening intermittent one month of ear pressure and frontal headaches. + nasal congestion and PND. no rhinorrhea or sinus pain. No fever or chills. No cough. No sob. Uses Claritin daily. Usually has issues every fall with her sinuses. Denies any history of sinus surgery.   Depression screen [facility] 2/9 08/21/2017 02/13/2017 01/11/2017  Decreased Interest 0 0 0  Down, Depressed, Hopeless 0 0 0  PHQ - 2 Score 0 0 0    Allergies  Allergen Reactions  . Atropine Hives  . Caffeine Other (See Comments)    Shakes; hot/cold flashes;dizziness  . Morphine And Related Itching    Prior to Admission medications   Medication Sig Start Date End Date Taking? Authorizing Provider  loratadine (CLARITIN) 10 MG tablet Take 1 tablet (10 mg total) by mouth daily. 02/13/17  Yes Ethelda Chick, MD  fluticasone Aleda Grana) 50 MCG/ACT nasal spray Place 2 sprays into both nostrils daily. 08/21/17   Myles Lipps, MD    Past Medical History:  Diagnosis Date  . Allergy    Claritin  . Blood transfusion without reported diagnosis 2001  . Fever blister     Past Surgical History:  Procedure Laterality Date  . ABDOMINAL HYSTERECTOMY     DUB.  ovaries intact.    Marland Kitchen BREAST SURGERY     implants B  . CESAREAN SECTION    . COSMETIC SURGERY     tummy tucks, bilateral breast implants  . HERNIA REPAIR  1969   Bilateral; age 75 year old.  . TUBAL LIGATION      Social History  Substance Use Topics  . Smoking status: Never Smoker  . Smokeless tobacco: Never Used  . Alcohol use Yes     Comment: occasionally    Family History  Problem Relation Age of Onset  . Cancer Mother 34       Non-Hodgkin's  Lymphoma  . Alzheimer's disease Father   . Hyperlipidemia Father   . Heart disease Father     ROS Per hpi  OBJECTIVE:  Blood pressure 116/72, pulse 83, temperature 98.8 F (37.1 C), temperature source Oral, resp. rate 17, height  (1.549 m), weight 107 lb 9.6 oz (48.8 kg), SpO2 94 %.  Physical Exam  Constitutional: She is oriented to person, place, and time and well-developed, well-nourished, and in no distress.  HENT:  Head: Normocephalic and atraumatic.  Right Ear: Hearing, tympanic membrane, external ear and ear canal normal.  Left Ear: Hearing, tympanic membrane, external ear and ear canal normal.  Nose: Mucosal edema present. No rhinorrhea. Right sinus exhibits no maxillary sinus tenderness and no frontal sinus tenderness. Left sinus exhibits no maxillary sinus tenderness and no frontal sinus tenderness.  Mouth/Throat: Oropharynx is clear and moist.  Eyes: Pupils are equal, round, and reactive to light. EOM are normal.  Neck: Neck supple.  Cardiovascular: Normal rate, regular rhythm and normal heart sounds.  Exam reveals no gallop and no friction rub.   No murmur heard. Pulmonary/Chest: Effort normal and breath sounds normal. She has no wheezes. She has no rales.  Lymphadenopathy:    She has no cervical adenopathy.  Neurological: She is alert and oriented  to person, place, and time. Gait normal.  Skin: Skin is warm and dry.  Nursing note and vitals reviewed.     ASSESSMENT and PLAN  1. Dysfunction of both eustachian tubes Discussed no ssx of sinus infection at this time. Discussed staring flonase, r/se/b discussed. RTC precautions given. Paitent educational handout given. - fluticasone (FLONASE) 50 MCG/ACT nasal spray; Place 2 sprays into both nostrils daily.  Return if symptoms worsen or fail to improve.    Myles Lipps, MD Primary Care at [facility] [address] [place], [place] [address] Ph.  [phone] Fax [phone]

## 2017-11-28 DIAGNOSIS — R1032 Left lower quadrant pain: Secondary | ICD-10-CM | POA: Diagnosis not present

## 2017-11-28 DIAGNOSIS — R102 Pelvic and perineal pain: Secondary | ICD-10-CM | POA: Diagnosis not present

## 2017-11-30 DIAGNOSIS — Z01419 Encounter for gynecological examination (general) (routine) without abnormal findings: Secondary | ICD-10-CM | POA: Diagnosis not present

## 2017-12-03 DIAGNOSIS — R1032 Left lower quadrant pain: Secondary | ICD-10-CM | POA: Diagnosis not present

## 2018-01-26 DIAGNOSIS — Z1231 Encounter for screening mammogram for malignant neoplasm of breast: Secondary | ICD-10-CM | POA: Diagnosis not present

## 2018-01-26 DIAGNOSIS — Z803 Family history of malignant neoplasm of breast: Secondary | ICD-10-CM | POA: Diagnosis not present

## 2018-02-16 ENCOUNTER — Ambulatory Visit (INDEPENDENT_AMBULATORY_CARE_PROVIDER_SITE_OTHER): Payer: BLUE CROSS/BLUE SHIELD

## 2018-02-16 ENCOUNTER — Other Ambulatory Visit: Payer: Self-pay

## 2018-02-16 ENCOUNTER — Ambulatory Visit: Payer: BLUE CROSS/BLUE SHIELD | Admitting: Family Medicine

## 2018-02-16 VITALS — BP 117/75 | HR 67 | Temp 98.0°F | Resp 16 | Ht 61.0 in | Wt 112.0 lb

## 2018-02-16 DIAGNOSIS — M654 Radial styloid tenosynovitis [de Quervain]: Secondary | ICD-10-CM

## 2018-02-16 DIAGNOSIS — M25531 Pain in right wrist: Secondary | ICD-10-CM | POA: Diagnosis not present

## 2018-02-16 DIAGNOSIS — M79644 Pain in right finger(s): Secondary | ICD-10-CM | POA: Diagnosis not present

## 2018-02-16 DIAGNOSIS — M79641 Pain in right hand: Secondary | ICD-10-CM | POA: Diagnosis not present

## 2018-02-16 NOTE — Patient Instructions (Addendum)
   IF you received an x-ray today, you will receive an invoice from [facility]. Please contact [facility] at [phone] with questions or concerns regarding your invoice.   IF you received labwork today, you will receive an invoice from [facility]. Please contact [facility] at [phone] with questions or concerns regarding your invoice.   Our billing staff will not be able to assist you with questions regarding bills from these companies.  You will be contacted with the lab results as soon as they are available. The fastest way to get your results is to activate your My Chart account. Instructions are located on the last page of this paperwork. If you have not heard from us regarding the results in 2 weeks, please contact this office.     De Quervain Tenosynovitis Tendons attach muscles to bones. They also help with joint movements. When tendons become irritated or swollen, it is called tendinitis. The extensor pollicis brevis (EPB) tendon connects the EPB muscle to a bone that is near the base of the thumb. The EPB muscle helps to straighten and extend the thumb. De Quervain tenosynovitis is a condition in which the EPB tendon lining (sheath) becomes irritated, thickened, and swollen. This condition is sometimes called stenosing tenosynovitis. This condition causes pain on the thumb side of the back of the wrist. What are the causes? Causes of this condition include:  Activities that repeatedly cause your thumb and wrist to extend.  A sudden increase in activity or change in activity that affects your wrist.  What increases the risk? This condition is more likely to develop in:  Females.  People who have diabetes.  Women who have recently given birth.  People who are over 40 years of age.  People who do activities that involve repeated hand and wrist motions, such as tennis, racquetball, volleyball, gardening, and taking care of children.  People who do  heavy labor.  People who have poor wrist strength and flexibility.  People who do not warm up properly before activities.  What are the signs or symptoms? Symptoms of this condition include:  Pain or tenderness over the thumb side of the back of the wrist when your thumb and wrist are not moving.  Pain that gets worse when you straighten your thumb or extend your thumb or wrist.  Pain when the injured area is touched.  Locking or catching of the thumb joint while you bend and straighten your thumb.  Decreased thumb motion due to pain.  Swelling over the affected area.  How is this diagnosed? This condition is diagnosed with a medical history and physical exam. Your health care provider will ask for details about your injury and ask about your symptoms. How is this treated? Treatment may include the use of icing and medicines to reduce pain and swelling. You may also be advised to wear a splint or brace to limit your thumb and wrist motion. In less severe cases, treatment may also include working with a physical therapist to strengthen your wrist and calm the irritation around your EPB tendon sheath. In severe cases, surgery may be needed. Follow these instructions at home: If you have a splint or brace:  Wear it as told by your health care provider. Remove it only as told by your health care provider.  Loosen the splint or brace if your fingers become numb and tingle, or if they turn cold and blue.  Keep the splint or brace clean and dry. Managing pain, stiffness, and   swelling  If directed, apply ice to the injured area. ? Put ice in a plastic bag. ? Place a towel between your skin and the bag. ? Leave the ice on for 20 minutes, 2-3 times per day.  Move your fingers often to avoid stiffness and to lessen swelling.  Raise (elevate) the injured area above the level of your heart while you are sitting or lying down. General instructions  Return to your normal activities as  told by your health care provider. Ask your health care provider what activities are safe for you.  Take over-the-counter and prescription medicines only as told by your health care provider.  Keep all follow-up visits as told by your health care provider. This is important.  Do not drive or operate heavy machinery while taking prescription pain medicine. Contact a health care provider if:  Your pain, tenderness, or swelling gets worse, even if you have had treatment.  You have numbness or tingling in your wrist, hand, or fingers on the injured side. This information is not intended to replace advice given to you by your health care provider. Make sure you discuss any questions you have with your health care provider. Document Released: 10/30/2005 Document Revised: 04/06/2016 Document Reviewed: 01/05/2015 Elsevier Interactive Patient Education  2018 Elsevier Inc.  

## 2018-02-16 NOTE — Progress Notes (Signed)
Subjective:    Patient ID: Monique Stephens, female    DOB: 07/12/1966, 52 y.o.   MRN: [id number]  02/16/2018  Wrist Pain (x 2 wks/ pt states her wrist is is pain when she is lifting items or moving it around. fingers are numb at times)    HPI This 52 y.o. female presents for evaluation of RIGHT wrist and thumb pain onset four weeks ago. No injury or trauma or overuse.  Pain radiates from wrist and thumb up into forearm; intermittent numbness and tingling along first and second digits.  No nighttime awakening unless moves wrist. Very painful movement of wrist and thumb; no swelling. Has been wearing wrist splint intermittently with persistent symptoms.  Works at [employer] and also works out regularly; no change in workout schedule or regimen; no change in work intensity or schedule.   BP Readings from Last 3 Encounters:  02/16/18 117/75  08/21/17 116/72  03/22/17 118/66   Wt Readings from Last 3 Encounters:  02/16/18 112 lb (50.8 kg)  08/21/17 107 lb 9.6 oz (48.8 kg)  03/22/17 115 lb (52.2 kg)   Immunization History  Administered Date(s) Administered  . Tdap 03/09/2016    Review of Systems  Constitutional: Negative for chills, diaphoresis, fatigue and fever.  HENT: Negative for ear pain, postnasal drip, rhinorrhea, sinus pressure, sore throat and trouble swallowing.   Respiratory: Negative for cough and shortness of breath.   Cardiovascular: Negative for chest pain, palpitations and leg swelling.  Gastrointestinal: Negative for abdominal pain, constipation, diarrhea, nausea and vomiting.  Musculoskeletal: Positive for arthralgias and myalgias. Negative for joint swelling, neck pain and neck stiffness.  Skin: Negative for color change.  Neurological: Positive for numbness. Negative for weakness.    Past Medical History:  Diagnosis Date  . Allergy    Claritin  . Blood transfusion without reported diagnosis 2001  . Fever blister    Past Surgical History:  Procedure  Laterality Date  . ABDOMINAL HYSTERECTOMY     DUB.  ovaries intact.    Marland Kitchen BREAST SURGERY     implants B  . CESAREAN SECTION    . COSMETIC SURGERY     tummy tucks, bilateral breast implants  . HERNIA REPAIR  1969   Bilateral; age 66 year old.  . TUBAL LIGATION     Allergies  Allergen Reactions  . Atropine Hives  . Caffeine Other (See Comments)    Shakes; hot/cold flashes;dizziness  . Morphine And Related Itching   Current Outpatient Medications on File Prior to Visit  Medication Sig Dispense Refill  . fluticasone (FLONASE) 50 MCG/ACT nasal spray Place 2 sprays into both nostrils daily. 16 g 6  . loratadine (CLARITIN) 10 MG tablet Take 1 tablet (10 mg total) by mouth daily. 90 tablet 3   No current facility-administered medications on file prior to visit.    Social History   Socioeconomic History  . Marital status: Married    Spouse name: n/a  . Number of children: 2  . Years of education: 26  . Highest education level: Not on file  Occupational History  . Occupation: Engineering geologist: [employer]  Social Needs  . Financial resource strain: Not on file  . Food insecurity:    Worry: Not on file    Inability: Not on file  . Transportation needs:    Medical: Not on file    Non-medical: Not on file  Tobacco Use  . Smoking status: Never Smoker  .  Smokeless tobacco: Never Used  Substance and Sexual Activity  . Alcohol use: Yes    Comment: occasionally  . Drug use: No  . Sexual activity: Yes    Partners: Male    Birth control/protection: Surgical  Lifestyle  . Physical activity:    Days per week: Not on file    Minutes per session: Not on file  . Stress: Not on file  Relationships  . Social connections:    Talks on phone: Not on file    Gets together: Not on file    Attends religious service: Not on file    Active member of club or organization: Not on file    Attends meetings of clubs or organizations: Not on file    Relationship status:  Not on file  . Intimate partner violence:    Fear of current or ex partner: Not on file    Emotionally abused: Not on file    Physically abused: Not on file    Forced sexual activity: Not on file  Other Topics Concern  . 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      Exercise: 2-3 days per week; one hour with personal trainer.    Drinks 1 cup of coffee 5 days a week, occasional soda    Family History  Problem Relation Age of Onset  . Cancer Mother 2761       Non-Hodgkin's Lymphoma  . Alzheimer's disease Father   . Hyperlipidemia Father   . Heart disease Father        Objective:    BP 117/75   Pulse 67   Temp 98 F (36.7 C) (Oral)   Resp 16   Ht 5\' 1"  (1.549 m)   Wt 112 lb (50.8 kg)   SpO2 97%   BMI 21.16 kg/m  Physical Exam  Constitutional: She is oriented to person, place, and time. She appears well-developed and well-nourished. No distress.  HENT:  Head: Normocephalic and atraumatic.  Eyes: Pupils are equal, round, and reactive to light. Conjunctivae are normal.  Neck: Normal range of motion. Neck supple.  Cardiovascular: Normal rate, regular rhythm and normal heart sounds. Exam reveals no gallop and no friction rub.  No murmur heard. Pulmonary/Chest: Effort normal and breath sounds normal. She has no wheezes. She has no rales.  Musculoskeletal:       Right wrist: She exhibits normal range of motion, no tenderness, no bony tenderness and no swelling.       Right hand: She exhibits decreased range of motion, tenderness and bony tenderness. She exhibits no swelling. Normal sensation noted. Normal strength noted.  Painful range of motion of R wrist; painful ROM of R thumb; Finkelstein's positive.  Neurological: She is alert and oriented to person, place, and  time.  Skin: She is not diaphoretic.  Psychiatric: She has a normal mood and affect. Her behavior is normal.  Nursing note and vitals reviewed.  Dg Wrist Complete Right  Result Date: 02/16/2018 CLINICAL DATA:  R thumb and wrist pain for four weeks; no injury or trauma EXAM: RIGHT WRIST - COMPLETE 3+ VIEW COMPARISON:  None. FINDINGS: There is no evidence of fracture or dislocation. There is no evidence of arthropathy  or other focal bone abnormality. Soft tissues are unremarkable. IMPRESSION: Negative. Electronically Signed   By: Amie Portland M.D.   On: 02/16/2018 11:22   Dg Finger Thumb Right  Result Date: 02/16/2018 CLINICAL DATA:  R thumb and wrist pain for four weeks; no injury or trauma EXAM: RIGHT THUMB 2+V COMPARISON:  None. FINDINGS: No fracture.  No bone lesion. The joints are normally spaced and aligned. No arthropathic changes. Normal soft tissues. IMPRESSION: Negative. Electronically Signed   By: Amie Portland M.D.   On: 02/16/2018 11:21   Depression screen [facility] 2/9 02/16/2018 08/21/2017 02/13/2017 01/11/2017 03/09/2016  Decreased Interest 0 0 0 0 0  Down, Depressed, Hopeless 0 0 0 0 0  PHQ - 2 Score 0 0 0 0 0   Fall Risk  02/16/2018 08/21/2017 02/13/2017 01/11/2017 03/09/2016  Falls in the past year? No No No No No        Assessment & Plan:   1. Thumb pain, right   2. Right wrist pain   3. De Quervain's disease (tenosynovitis)     De Quervain's tenosynovitis: New onset of right thumb.  Status post x-rays that were negative.  Thumb spica splint provided today.   Recommend rest and icing.  If no improvement in 1 month, call for hand surgery referral.  Orders Placed This Encounter  Procedures  . DG Finger Thumb Right    Standing Status:   Future    Number of Occurrences:   1    Standing Expiration Date:   04/20/2019    Order Specific Question:   Reason for Exam (SYMPTOM  OR DIAGNOSIS REQUIRED)    Answer:   R thumb and wrist pain for four weeks; no injury or trauma    Order Specific  Question:   Is the patient pregnant?    Answer:   No    Order Specific Question:   Preferred imaging location?    Answer:   External  . DG Wrist Complete Right    Standing Status:   Future    Number of Occurrences:   1    Standing Expiration Date:   02/16/2019    Order Specific Question:   Reason for Exam (SYMPTOM  OR DIAGNOSIS REQUIRED)    Answer:   R wrist and thumb pain; no injury or trauma    Order Specific Question:   Is the patient pregnant?    Answer:   No    Order Specific Question:   Preferred imaging location?    Answer:   External  . Thumb spica    velcro style splint RIGHT   No orders of the defined types were placed in this encounter.   No follow-ups on file.   Daiden Coltrane Paulita Fujita, M.D. Primary Care at [facility] [address] [place], [place]  [address] [phone] phone [phone] fax

## 2018-02-27 ENCOUNTER — Other Ambulatory Visit: Payer: Self-pay | Admitting: Family Medicine

## 2018-03-07 ENCOUNTER — Telehealth: Payer: Self-pay | Admitting: Family Medicine

## 2018-03-07 NOTE — Telephone Encounter (Signed)
Please call patient and advise her to return to the office for re-evaluation of her wrist.

## 2018-03-07 NOTE — Telephone Encounter (Signed)
Copied from CRM [id number]. Topic: Inquiry >> Mar 07, 2018  8:24 AM Maia Pettiesrtiz, Kristie S wrote: Pt called stating she was seen 3 weeks ago for tendonitis in wrist. Pt states that nothing has changed. It has not gotten better or worse. Pt is asking what is the next step. Please advise. Call work ph # 8:30am-5:00pm. Ok to leave a detailed msg as this is her direct line.

## 2018-03-08 NOTE — Telephone Encounter (Signed)
Patient calling back about her wrist. Advised patient on scheduling an office visit however she wanted me to relay that her schedule and Dr. Lonn GeorgiaSmiths schedule are not lining up. She would like a call back from Dr. Katrinka BlazingSmith or her cma at the earliest convenience.

## 2018-03-11 NOTE — Telephone Encounter (Signed)
Pt has been trying to get an apt with Dr Katrinka Blazing but her days fill up so fast. Pt would like to speak to Dr Katrinka Blazing or her CMA will send this to them and go from there.

## 2018-03-31 ENCOUNTER — Other Ambulatory Visit: Payer: Self-pay | Admitting: Family Medicine

## 2018-04-02 NOTE — Telephone Encounter (Signed)
Pt has appointment tomorrow for issue will take care of issue at appointment.

## 2018-04-03 ENCOUNTER — Ambulatory Visit: Payer: BLUE CROSS/BLUE SHIELD | Admitting: Family Medicine

## 2018-04-03 ENCOUNTER — Other Ambulatory Visit: Payer: Self-pay

## 2018-04-03 ENCOUNTER — Encounter: Payer: Self-pay | Admitting: Family Medicine

## 2018-04-03 VITALS — BP 112/72 | HR 90 | Temp 98.0°F | Resp 16 | Ht 61.22 in | Wt 115.0 lb

## 2018-04-03 DIAGNOSIS — M654 Radial styloid tenosynovitis [de Quervain]: Secondary | ICD-10-CM | POA: Diagnosis not present

## 2018-04-03 DIAGNOSIS — J301 Allergic rhinitis due to pollen: Secondary | ICD-10-CM | POA: Diagnosis not present

## 2018-04-03 DIAGNOSIS — B001 Herpesviral vesicular dermatitis: Secondary | ICD-10-CM

## 2018-04-03 MED ORDER — VALACYCLOVIR HCL 1 G PO TABS
ORAL_TABLET | ORAL | 1 refills | Status: DC
Start: 2018-04-03 — End: 2019-04-10

## 2018-04-03 MED ORDER — LORATADINE 10 MG PO TABS: 10.0000 mg | ORAL_TABLET | Freq: Every day | ORAL | 3 refills | Status: DC

## 2018-04-03 MED ORDER — FLUTICASONE PROPIONATE 50 MCG/ACT NA SUSP: 2.0000 | Freq: Every day | NASAL | 3 refills | Status: DC

## 2018-04-03 NOTE — Patient Instructions (Signed)
     IF you received an x-ray today, you will receive an invoice from [facility]. Please contact [facility] at [phone] with questions or concerns regarding your invoice.   IF you received labwork today, you will receive an invoice from [facility]. Please contact [facility] at [phone] with questions or concerns regarding your invoice.   Our billing staff will not be able to assist you with questions regarding bills from these companies.  You will be contacted with the lab results as soon as they are available. The fastest way to get your results is to activate your My Chart account. Instructions are located on the last page of this paperwork. If you have not heard from us regarding the results in 2 weeks, please contact this office.     

## 2018-04-03 NOTE — Progress Notes (Signed)
Subjective:    Patient ID: Monique Stephens, female    DOB: [birth year], 52 y.o.   MRN: [id number]  04/03/2018  Wrist Pain (follow-up from 4/6 pt states her right wrist does not feel any better since last OV. ) and Medication Refill (all medications )    HPI This 52 y.o. female presents for six week follow-up of R thumb pain/DeQuervain's tenosynovitis.   Chiropractor for ionophoresis.  Daughter works at [employer]. PRP therapy? 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 No numbness or tingling in hand.    Allergies are well-controlled with daily Claritin and Flonase.  Takes year-round and does not suffer with postnasal drainage.  Herpes labialis: Well-controlled with PRN Valtrex use.  Due for refill.   BP Readings from Last 3 Encounters:  05/24/18 118/76  04/03/18 112/72  02/16/18 117/75   Wt Readings from Last 3 Encounters:  05/24/18 111 lb (50.3 kg)  04/03/18 115 lb (52.2 kg)  02/16/18 112 lb (50.8 kg)   Immunization History  Administered Date(s) Administered  . Tdap 03/09/2016    Review of Systems  Constitutional: Negative for chills, diaphoresis, fatigue and fever.  HENT: Negative for congestion, dental problem, drooling, ear discharge, ear pain, facial swelling, hearing loss, mouth sores, nosebleeds, postnasal drip, rhinorrhea, sinus pressure, sinus pain, sneezing, sore throat, tinnitus, trouble swallowing and voice change.   Eyes: Negative for visual disturbance.  Respiratory: Negative for cough and shortness of breath.   Cardiovascular: Negative for chest pain, palpitations and leg swelling.  Gastrointestinal: Negative for abdominal pain, constipation, diarrhea, nausea and vomiting.  Endocrine: Negative for cold intolerance, heat intolerance, polydipsia, polyphagia and polyuria.  Musculoskeletal: Positive for arthralgias. Negative for joint swelling, neck pain and neck stiffness.  Neurological: Negative for dizziness, tremors,  seizures, syncope, facial asymmetry, speech difficulty, weakness, light-headedness, numbness and headaches.    Past Medical History:  Diagnosis Date  . Allergy    Claritin  . Blood transfusion without reported diagnosis 2001  . Fever blister    Past Surgical History:  Procedure Laterality Date  . ABDOMINAL HYSTERECTOMY     DUB.  ovaries intact.    Marland Kitchen BREAST SURGERY     implants B  . CESAREAN SECTION    . COSMETIC SURGERY     tummy tucks, bilateral breast implants  . HERNIA REPAIR  1969   Bilateral; age 32 year old.  . TUBAL LIGATION     Allergies  Allergen Reactions  . Atropine Hives  . Caffeine Other (See Comments)    Shakes; hot/cold flashes;dizziness  . Morphine And Related Itching   No current outpatient medications on file prior to visit.   No current facility-administered medications on file prior to visit.    Social History   Socioeconomic History  . Marital status: Married    Spouse name: n/a  . Number of children: 2  . Years of education: 29  . Highest education level: Not on file  Occupational History  . Occupation: Engineering geologist: [employer]  Social Needs  . Financial resource strain: Not on file  . Food insecurity:    Worry: Not on file    Inability: Not on file  . Transportation needs:    Medical: Not on file    Non-medical: Not on file  Tobacco Use  . Smoking status: Never Smoker  . Smokeless tobacco: Never Used  Substance and Sexual Activity  . Alcohol use: Yes    Comment: occasionally  .  Drug use: No  . Sexual activity: Yes    Partners: Male    Birth control/protection: Surgical  Lifestyle  . Physical activity:    Days per week: Not on file    Minutes per session: Not on file  . Stress: Not on file  Relationships  . Social connections:    Talks on phone: Not on file    Gets together: Not on file    Attends religious service: Not on file    Active member of club or organization: Not on file    Attends  meetings of clubs or organizations: Not on file    Relationship status: Not on file  . Intimate partner violence:    Fear of current or ex partner: Not on file    Emotionally abused: Not on file    Physically abused: Not on file    Forced sexual activity: Not on file  Other Topics Concern  . Not on file  Social History Narrative   Marital status: married in 2018; second marriage      Children: 2 children (25, 42); no grandchildren      Lives: with husband      Employment: first Higher education careers adviser x 33 years      Alcohol: socially; not weekly.      Exercise: 2-3 days per week; one hour with personal trainer.    Drinks 1 cup of coffee 5 days a week, occasional soda    Family History  Problem Relation Age of Onset  . Cancer Mother 80       Non-Hodgkin's Lymphoma  . Alzheimer's disease Father   . Hyperlipidemia Father   . Heart disease Father        not sure       Objective:    BP 112/72   Pulse 90   Temp 98 F (36.7 C) (Oral)   Resp 16   Ht 5' 1.22" (1.555 m)   Wt 115 lb (52.2 kg)   SpO2 100%   BMI 21.57 kg/m  Physical Exam  Constitutional: She is oriented to person, place, and time. She appears well-developed and well-nourished. No distress.  HENT:  Head: Normocephalic and atraumatic.  Eyes: Pupils are equal, round, and reactive to light. Conjunctivae are normal.  Neck: Normal range of motion. Neck supple.  Cardiovascular: Normal rate, regular rhythm and normal heart sounds. Exam reveals no gallop and no friction rub.  No murmur heard. Pulmonary/Chest: Effort normal and breath sounds normal. She has no wheezes. She has no rales.  Musculoskeletal:       Right shoulder: Normal.       Right elbow: Normal.      Right wrist: Normal. She exhibits normal range of motion, no tenderness and no bony tenderness.       Right forearm: Normal.       Right hand: She exhibits decreased range of motion, tenderness and bony tenderness. Normal sensation noted. Normal strength noted.   R thumb with painful ROM; Finkelstein's positive.  Neurological: She is alert and oriented to person, place, and time.  Skin: She is not diaphoretic.  Psychiatric: She has a normal mood and affect. Her behavior is normal.  Nursing note and vitals reviewed.  No results found. Depression screen [facility] 2/9 05/24/2018 04/03/2018 02/16/2018 08/21/2017 02/13/2017  Decreased Interest 0 0 0 0 0  Down, Depressed, Hopeless 0 0 0 0 0  PHQ - 2 Score 0 0 0 0 0   Fall Risk  05/24/2018 04/03/2018 02/16/2018  08/21/2017 02/13/2017  Falls in the past year? No No No No No        Assessment & Plan:   1. De Quervain's disease (tenosynovitis)   2. Chronic seasonal allergic rhinitis due to pollen   3. Fever blister     R DeQuervain's synovitis:  Persistent; agreeable to trial of chiropractic care. If no improvement, call for hand surgery consultation.  Allergic Rhinitis: Controlled; refills provided.  HSV Labialis: controlled with PRN Valtrex.  No orders of the defined types were placed in this encounter.  Meds ordered this encounter  Medications  . fluticasone (FLONASE) 50 MCG/ACT nasal spray    Sig: Place 2 sprays into both nostrils daily.    Dispense:  48 g    Refill:  3  . loratadine (CLARITIN) 10 MG tablet    Sig: Take 1 tablet (10 mg total) by mouth daily. Needs appointment- annual exam due    Dispense:  90 tablet    Refill:  3  . valACYclovir (VALTREX) 1000 MG tablet    Sig: TAKE TWO TABLETS BY MOUTH TWICE DAILY FOR 2 DOSES PRN    Dispense:  30 tablet    Refill:  1    Return in about 3 months (around 07/04/2018) for complete physical examiniation MIKE MANI, complete physical examiniation.   Kristi Paulita Fujita, M.D. Primary Care at [facility] previously [facility] [address] [place], [place]  [address] [phone] phone [phone] fax

## 2018-04-08 ENCOUNTER — Encounter: Payer: Self-pay | Admitting: Family Medicine

## 2018-04-09 ENCOUNTER — Encounter: Payer: Self-pay | Admitting: Family Medicine

## 2018-04-18 DIAGNOSIS — M9907 Segmental and somatic dysfunction of upper extremity: Secondary | ICD-10-CM | POA: Diagnosis not present

## 2018-04-18 DIAGNOSIS — M65849 Other synovitis and tenosynovitis, unspecified hand: Secondary | ICD-10-CM | POA: Diagnosis not present

## 2018-04-18 DIAGNOSIS — M25531 Pain in right wrist: Secondary | ICD-10-CM | POA: Diagnosis not present

## 2018-04-22 DIAGNOSIS — M9907 Segmental and somatic dysfunction of upper extremity: Secondary | ICD-10-CM | POA: Diagnosis not present

## 2018-04-22 DIAGNOSIS — M25531 Pain in right wrist: Secondary | ICD-10-CM | POA: Diagnosis not present

## 2018-04-22 DIAGNOSIS — M65849 Other synovitis and tenosynovitis, unspecified hand: Secondary | ICD-10-CM | POA: Diagnosis not present

## 2018-04-26 DIAGNOSIS — M65849 Other synovitis and tenosynovitis, unspecified hand: Secondary | ICD-10-CM | POA: Diagnosis not present

## 2018-04-26 DIAGNOSIS — M25531 Pain in right wrist: Secondary | ICD-10-CM | POA: Diagnosis not present

## 2018-04-26 DIAGNOSIS — M9907 Segmental and somatic dysfunction of upper extremity: Secondary | ICD-10-CM | POA: Diagnosis not present

## 2018-04-29 DIAGNOSIS — M9907 Segmental and somatic dysfunction of upper extremity: Secondary | ICD-10-CM | POA: Diagnosis not present

## 2018-04-29 DIAGNOSIS — M25531 Pain in right wrist: Secondary | ICD-10-CM | POA: Diagnosis not present

## 2018-04-29 DIAGNOSIS — M65849 Other synovitis and tenosynovitis, unspecified hand: Secondary | ICD-10-CM | POA: Diagnosis not present

## 2018-05-02 DIAGNOSIS — M25531 Pain in right wrist: Secondary | ICD-10-CM | POA: Diagnosis not present

## 2018-05-02 DIAGNOSIS — M9907 Segmental and somatic dysfunction of upper extremity: Secondary | ICD-10-CM | POA: Diagnosis not present

## 2018-05-02 DIAGNOSIS — M65849 Other synovitis and tenosynovitis, unspecified hand: Secondary | ICD-10-CM | POA: Diagnosis not present

## 2018-05-06 DIAGNOSIS — M65849 Other synovitis and tenosynovitis, unspecified hand: Secondary | ICD-10-CM | POA: Diagnosis not present

## 2018-05-06 DIAGNOSIS — M25531 Pain in right wrist: Secondary | ICD-10-CM | POA: Diagnosis not present

## 2018-05-06 DIAGNOSIS — M9907 Segmental and somatic dysfunction of upper extremity: Secondary | ICD-10-CM | POA: Diagnosis not present

## 2018-05-08 DIAGNOSIS — M9907 Segmental and somatic dysfunction of upper extremity: Secondary | ICD-10-CM | POA: Diagnosis not present

## 2018-05-08 DIAGNOSIS — M65849 Other synovitis and tenosynovitis, unspecified hand: Secondary | ICD-10-CM | POA: Diagnosis not present

## 2018-05-08 DIAGNOSIS — M25531 Pain in right wrist: Secondary | ICD-10-CM | POA: Diagnosis not present

## 2018-05-15 DIAGNOSIS — M9907 Segmental and somatic dysfunction of upper extremity: Secondary | ICD-10-CM | POA: Diagnosis not present

## 2018-05-15 DIAGNOSIS — M65849 Other synovitis and tenosynovitis, unspecified hand: Secondary | ICD-10-CM | POA: Diagnosis not present

## 2018-05-15 DIAGNOSIS — M25531 Pain in right wrist: Secondary | ICD-10-CM | POA: Diagnosis not present

## 2018-05-23 DIAGNOSIS — M25531 Pain in right wrist: Secondary | ICD-10-CM | POA: Diagnosis not present

## 2018-05-23 DIAGNOSIS — M65849 Other synovitis and tenosynovitis, unspecified hand: Secondary | ICD-10-CM | POA: Diagnosis not present

## 2018-05-23 DIAGNOSIS — M9907 Segmental and somatic dysfunction of upper extremity: Secondary | ICD-10-CM | POA: Diagnosis not present

## 2018-05-23 DIAGNOSIS — M47817 Spondylosis without myelopathy or radiculopathy, lumbosacral region: Secondary | ICD-10-CM | POA: Diagnosis not present

## 2018-05-24 ENCOUNTER — Ambulatory Visit (INDEPENDENT_AMBULATORY_CARE_PROVIDER_SITE_OTHER): Payer: BLUE CROSS/BLUE SHIELD | Admitting: Family Medicine

## 2018-05-24 ENCOUNTER — Encounter: Payer: Self-pay | Admitting: Family Medicine

## 2018-05-24 ENCOUNTER — Other Ambulatory Visit: Payer: Self-pay

## 2018-05-24 VITALS — BP 118/76 | HR 98 | Temp 98.0°F | Resp 16 | Ht 61.22 in | Wt 111.0 lb

## 2018-05-24 DIAGNOSIS — Z0000 Encounter for general adult medical examination without abnormal findings: Secondary | ICD-10-CM | POA: Diagnosis not present

## 2018-05-24 DIAGNOSIS — N200 Calculus of kidney: Secondary | ICD-10-CM | POA: Diagnosis not present

## 2018-05-24 DIAGNOSIS — Z13220 Encounter for screening for lipoid disorders: Secondary | ICD-10-CM | POA: Diagnosis not present

## 2018-05-24 DIAGNOSIS — F5101 Primary insomnia: Secondary | ICD-10-CM

## 2018-05-24 DIAGNOSIS — Z131 Encounter for screening for diabetes mellitus: Secondary | ICD-10-CM

## 2018-05-24 DIAGNOSIS — Z780 Asymptomatic menopausal state: Secondary | ICD-10-CM | POA: Diagnosis not present

## 2018-05-24 DIAGNOSIS — M654 Radial styloid tenosynovitis [de Quervain]: Secondary | ICD-10-CM

## 2018-05-24 DIAGNOSIS — J301 Allergic rhinitis due to pollen: Secondary | ICD-10-CM

## 2018-05-24 DIAGNOSIS — B001 Herpesviral vesicular dermatitis: Secondary | ICD-10-CM

## 2018-05-24 LAB — POCT URINALYSIS DIP (MANUAL ENTRY)
BILIRUBIN UA: NEGATIVE mg/dL
GLUCOSE UA: NEGATIVE mg/dL
NITRITE UA: NEGATIVE
Protein Ur, POC: NEGATIVE mg/dL
Spec Grav, UA: 1.02 (ref 1.010–1.025)
Urobilinogen, UA: 1 E.U./dL
pH, UA: 6 (ref 5.0–8.0)

## 2018-05-24 NOTE — Progress Notes (Signed)
Subjective:    Patient ID: Monique Stephens, female    DOB: May 06, 1966, 52 y.o.   MRN: [id number]  05/24/2018  Annual Exam    HPI This 52 y.o. female presents for COMPLETE PHYSICAL EXAMINATION.   Last physical:  02-13-2017 Pap smear: 2017; hysterectomy; [place] Mammogram:  01/2018 Colonoscopy:  Not yet; has been putting off.  Hung.   Eye exam:  Glasses; last appointment 11/2016. Dental exam:  Having periodontal surgery next month.  R wrist DeQuervains tenosynovitis: has been undergoing chiropractic care with ultrasound therapy; no improvement; ongoing persistent pain in wrist/thumb region.    Visual Acuity Screening   Right eye Left eye Both eyes  Without correction:     With correction: 20/30 20/30 20/25     BP Readings from Last 3 Encounters:  05/24/18 118/76  04/03/18 112/72  02/16/18 117/75   Wt Readings from Last 3 Encounters:  05/24/18 111 lb (50.3 kg)  04/03/18 115 lb (52.2 kg)  02/16/18 112 lb (50.8 kg)   Immunization History  Administered Date(s) Administered  . Tdap 03/09/2016   Health Maintenance  Topic Date Due  . COLONOSCOPY  01/08/2014  . INFLUENZA VACCINE  06/13/2018  . PAP SMEAR  08/22/2019  . MAMMOGRAM  01/30/2020  . TETANUS/TDAP  03/09/2026  . Hepatitis C Screening  Completed  . HIV Screening  Completed   Review of Systems  Constitutional: Negative for activity change, appetite change, chills, diaphoresis, fatigue, fever and unexpected weight change.  HENT: Negative for congestion, dental problem, drooling, ear discharge, ear pain, facial swelling, hearing loss, mouth sores, nosebleeds, postnasal drip, rhinorrhea, sinus pressure, sneezing, sore throat, tinnitus, trouble swallowing and voice change.   Eyes: Negative for photophobia, pain, discharge, redness, itching and visual disturbance.  Respiratory: Negative for apnea, cough, choking, chest tightness, shortness of breath, wheezing and stridor.   Cardiovascular: Negative for chest pain,  palpitations and leg swelling.  Gastrointestinal: Negative for abdominal distention, abdominal pain, anal bleeding, blood in stool, constipation, diarrhea, nausea, rectal pain and vomiting.  Endocrine: Negative for cold intolerance, heat intolerance, polydipsia, polyphagia and polyuria.  Genitourinary: Negative for decreased urine volume, difficulty urinating, dyspareunia, dysuria, enuresis, flank pain, frequency, genital sores, hematuria, menstrual problem, pelvic pain, urgency, vaginal bleeding, vaginal discharge and vaginal pain.       Nocturia x 3-5.  No urinary leakage.  Musculoskeletal: Positive for arthralgias. Negative for back pain, gait problem, joint swelling, myalgias, neck pain and neck stiffness.  Skin: Negative for color change, pallor, rash and wound.  Allergic/Immunologic: Negative for environmental allergies, food allergies and immunocompromised state.  Neurological: Negative for dizziness, tremors, seizures, syncope, facial asymmetry, speech difficulty, weakness, light-headedness, numbness and headaches.  Hematological: Negative for adenopathy. Does not bruise/bleed easily.  Psychiatric/Behavioral: Positive for sleep disturbance. Negative for agitation, behavioral problems, confusion, decreased concentration, dysphoric mood, hallucinations, self-injury and suicidal ideas. The patient is not nervous/anxious and is not hyperactive.        Bedtime 10-11; wakes up 6.  Sleeps four hours per day.    Past Medical History:  Diagnosis Date  . Allergy    Claritin  . Blood transfusion without reported diagnosis 2001  . Fever blister    Past Surgical History:  Procedure Laterality Date  . ABDOMINAL HYSTERECTOMY     DUB.  ovaries intact.    Marland Kitchen BREAST SURGERY     implants B  . CESAREAN SECTION    . COSMETIC SURGERY     tummy tucks, bilateral breast implants  . HERNIA REPAIR  1969   Bilateral; age 62 year old.  . TUBAL LIGATION     Allergies  Allergen Reactions  . Atropine Hives   . Caffeine Other (See Comments)    Shakes; hot/cold flashes;dizziness  . Morphine And Related Itching   Current Outpatient Medications on File Prior to Visit  Medication Sig Dispense Refill  . fluticasone (FLONASE) 50 MCG/ACT nasal spray Place 2 sprays into both nostrils daily. 48 g 3  . loratadine (CLARITIN) 10 MG tablet Take 1 tablet (10 mg total) by mouth daily. Needs appointment- annual exam due 90 tablet 3  . valACYclovir (VALTREX) 1000 MG tablet TAKE TWO TABLETS BY MOUTH TWICE DAILY FOR 2 DOSES PRN 30 tablet 1   No current facility-administered medications on file prior to visit.    Social History   Socioeconomic History  . Marital status: Married    Spouse name: n/a  . Number of children: 2  . Years of education: 65  . Highest education level: Not on file  Occupational History  . Occupation: Engineering geologist: [employer]  Social Needs  . Financial resource strain: Not on file  . Food insecurity:    Worry: Not on file    Inability: Not on file  . Transportation needs:    Medical: Not on file    Non-medical: Not on file  Tobacco Use  . Smoking status: Never Smoker  . Smokeless tobacco: Never Used  Substance and Sexual Activity  . Alcohol use: Yes    Comment: occasionally  . Drug use: No  . Sexual activity: Yes    Partners: Male    Birth control/protection: Surgical  Lifestyle  . Physical activity:    Days per week: Not on file    Minutes per session: Not on file  . Stress: Not on file  Relationships  . Social connections:    Talks on phone: Not on file    Gets together: Not on file    Attends religious service: Not on file    Active member of club or organization: Not on file    Attends meetings of clubs or organizations: Not on file    Relationship status: Not on file  . Intimate partner violence:    Fear of current or ex partner: Not on file    Emotionally abused: Not on file    Physically abused: Not on file    Forced sexual  activity: Not on file  Other Topics Concern  . Not on file  Social History Narrative   Marital status: married in 2018; second marriage      Children: 2 children (25, 54); no grandchildren      Lives: with husband      Employment: first Higher education careers adviser x 33 years      Alcohol: socially; not weekly.      Exercise: 2-3 days per week; one hour with personal trainer.    Drinks 1 cup of coffee 5 days a week, occasional soda    Family History  Problem Relation Age of Onset  . Cancer Mother 40       Non-Hodgkin's Lymphoma  . Alzheimer's disease Father   . Hyperlipidemia Father   . Heart disease Father        not sure       Objective:    BP 118/76   Pulse 98   Temp 98 F (36.7 C) (Oral)   Resp 16   Wt 111 lb (50.3 kg)  SpO2 100%   BMI 20.82 kg/m  Physical Exam  Constitutional: She is oriented to person, place, and time. She appears well-developed and well-nourished. No distress.  HENT:  Head: Normocephalic and atraumatic.  Right Ear: External ear normal.  Left Ear: External ear normal.  Nose: Nose normal.  Mouth/Throat: Oropharynx is clear and moist.  Eyes: Pupils are equal, round, and reactive to light. Conjunctivae and EOM are normal.  Neck: Normal range of motion and full passive range of motion without pain. Neck supple. No JVD present. Carotid bruit is not present. No thyromegaly present.  Cardiovascular: Normal rate, regular rhythm and normal heart sounds. Exam reveals no gallop and no friction rub.  No murmur heard. Pulmonary/Chest: Effort normal and breath sounds normal. She has no wheezes. She has no rales.  Abdominal: Soft. Bowel sounds are normal. She exhibits no distension and no mass. There is no tenderness. There is no rebound and no guarding.  Musculoskeletal:       Right shoulder: Normal.       Left shoulder: Normal.       Cervical back: Normal.  Lymphadenopathy:    She has no cervical adenopathy.  Neurological: She is alert and oriented to person,  place, and time. She has normal reflexes. No cranial nerve deficit. She exhibits normal muscle tone. Coordination normal.  Skin: Skin is warm and dry. No rash noted. She is not diaphoretic. No erythema. No pallor.  Psychiatric: She has a normal mood and affect. Her behavior is normal. Judgment and thought content normal.  Nursing note and vitals reviewed.  No results found. [facility] 05/24/2018 04/03/2018 02/16/2018 08/21/2017 02/13/2017  Decreased Interest 0 0 0 0 0  Down, Depressed, Hopeless 0 0 0 0 0  PHQ - 2 Score 0 0 0 0 0   Fall Risk  05/24/2018 04/03/2018 02/16/2018 08/21/2017 02/13/2017  Falls in the past year? No No No No No        Assessment & Plan:   1. Routine physical examination   2. Chronic seasonal allergic rhinitis due to pollen   3. Primary insomnia   4. Screening for diabetes mellitus   5. Screening, lipid   6. De Quervain's disease (tenosynovitis)   7. Asymptomatic menopausal state   8. Fever blister   9. Kidney stones, mixed calcium oxalate     -anticipatory guidance provided --- exercise, weight loss, safe driving practices, calcium or dairy 3 servings daily. -obtain age appropriate screening labs and labs for chronic disease management. -asymptomatic menopausal state: patient requesting labs to evaluate menopausal status. -persistent R wrist De Quervain's tenosynovitis: has failed conservative therapy; has failed chiropractic care; refer to hand surgery for consultation. -Allergic rhinitis: controlled with daily claritin and flonase. -HSV labialis: stable with Valtrex. -chronic insomnia: stable/persistent; s/p sleep study in 2018.   Orders Placed This Encounter  Procedures  . CBC with Differential/Platelet  . Comprehensive metabolic panel    Order Specific Question:   Has the patient fasted?    Answer:   No  . Hemoglobin A1c  . Lipid panel    Order Specific Question:   Has the patient fasted?    Answer:   No  . TSH  . FSH/LH  . Ambulatory  referral to Hand Surgery    Referral Priority:   Routine    Referral Type:   Surgical    Referral Reason:   Specialty Services Required    Requested Specialty:   Hand Surgery    Number of  Visits Requested:   1  . POCT urinalysis dipstick   No orders of the defined types were placed in this encounter.   Return in about 1 year (around 05/25/2019) for complete physical examiniation MIKE MANI, PA-C.   Cecilie Lowers, M.D. Primary Care at [facility] previously [facility] [address] [place], [place]  [address] [phone] phone [phone] fax'

## 2018-05-24 NOTE — Patient Instructions (Addendum)
IF you received an x-ray today, you will receive an invoice from [facility]. Please contact [facility] at [phone] with questions or concerns regarding your invoice.   IF you received labwork today, you will receive an invoice from [facility]. Please contact [facility] at [phone] with questions or concerns regarding your invoice.   Our billing staff will not be able to assist you with questions regarding bills from these companies.  You will be contacted with the lab results as soon as they are available. The fastest way to get your results is to activate your My Chart account. Instructions are located on the last page of this paperwork. If you have not heard from [place] regarding the results in 2 weeks, please contact this office.      Preventive Care 40-64 Years, Female Preventive care refers to lifestyle choices and visits with your health care provider that can promote health and wellness. What does preventive care include?  A yearly physical exam. This is also called an annual well check.  Dental exams once or twice a year.  Routine eye exams. Ask your health care provider how often you should have your eyes checked.  Personal lifestyle choices, including: ? Daily care of your teeth and gums. ? Regular physical activity. ? Eating a healthy diet. ? Avoiding tobacco and drug use. ? Limiting alcohol use. ? Practicing safe sex. ? Taking low-dose aspirin daily starting at age 34. ? Taking vitamin and mineral supplements as recommended by your health care provider. What happens during an annual well check? The services and screenings done by your health care provider during your annual well check will depend on your age, overall health, lifestyle risk factors, and family history of disease. Counseling Your health care provider may ask you questions about your:  Alcohol use.  Tobacco use.  Drug use.  Emotional well-being.  Home and relationship  well-being.  Sexual activity.  Eating habits.  Work and work Statistician.  Method of birth control.  Menstrual cycle.  Pregnancy history.  Screening You may have the following tests or measurements:  Height, weight, and BMI.  Blood pressure.  Lipid and cholesterol levels. These may be checked every 5 years, or more frequently if you are over 65 years old.  Skin check.  Lung cancer screening. You may have this screening every year starting at age 102 if you have a 30-pack-year history of smoking and currently smoke or have quit within the past 15 years.  Fecal occult blood test (FOBT) of the stool. You may have this test every year starting at age 33.  Flexible sigmoidoscopy or colonoscopy. You may have a sigmoidoscopy every 5 years or a colonoscopy every 10 years starting at age 23.  Hepatitis C blood test.  Hepatitis B blood test.  Sexually transmitted disease (STD) testing.  Diabetes screening. This is done by checking your blood sugar (glucose) after you have not eaten for a while (fasting). You may have this done every 1-3 years.  Mammogram. This may be done every 1-2 years. Talk to your health care provider about when you should start having regular mammograms. This may depend on whether you have a family history of breast cancer.  BRCA-related cancer screening. This may be done if you have a family history of breast, ovarian, tubal, or peritoneal cancers.  Pelvic exam and Pap test. This may be done every 3 years starting at age 3. Starting at age 55, this may be done every 5 years if  you have a Pap test in combination with an HPV test.  Bone density scan. This is done to screen for osteoporosis. You may have this scan if you are at high risk for osteoporosis.  Discuss your test results, treatment options, and if necessary, the need for more tests with your health care provider. Vaccines Your health care provider may recommend certain vaccines, such  as:  Influenza vaccine. This is recommended every year.  Tetanus, diphtheria, and acellular pertussis (Tdap, Td) vaccine. You may need a Td booster every 10 years.  Varicella vaccine. You may need this if you have not been vaccinated.  Zoster vaccine. You may need this after age 77.  Measles, mumps, and rubella (MMR) vaccine. You may need at least one dose of MMR if you were born in 1957 or later. You may also need a second dose.  Pneumococcal 13-valent conjugate (PCV13) vaccine. You may need this if you have certain conditions and were not previously vaccinated.  Pneumococcal polysaccharide (PPSV23) vaccine. You may need one or two doses if you smoke cigarettes or if you have certain conditions.  Meningococcal vaccine. You may need this if you have certain conditions.  Hepatitis A vaccine. You may need this if you have certain conditions or if you travel or work in places where you may be exposed to hepatitis A.  Hepatitis B vaccine. You may need this if you have certain conditions or if you travel or work in places where you may be exposed to hepatitis B.  Haemophilus influenzae type b (Hib) vaccine. You may need this if you have certain conditions.  Talk to your health care provider about which screenings and vaccines you need and how often you need them. This information is not intended to replace advice given to you by your health care provider. Make sure you discuss any questions you have with your health care provider. Document Released: 11/26/2015 Document Revised: 07/19/2016 Document Reviewed: 08/31/2015 Elsevier Interactive Patient Education  [employer].

## 2018-05-25 LAB — COMPREHENSIVE METABOLIC PANEL
ALBUMIN: 4.6 g/dL (ref 3.5–5.5)
ALK PHOS: 63 IU/L (ref 39–117)
ALT: 18 IU/L (ref 0–32)
AST: 30 IU/L (ref 0–40)
Albumin/Globulin Ratio: 1.8 (ref 1.2–2.2)
BUN / CREAT RATIO: 16 (ref 9–23)
BUN: 15 mg/dL (ref 6–24)
Bilirubin Total: 0.7 mg/dL (ref 0.0–1.2)
CALCIUM: 9.6 mg/dL (ref 8.7–10.2)
CO2: 25 mmol/L (ref 20–29)
CREATININE: 0.92 mg/dL (ref 0.57–1.00)
Chloride: 102 mmol/L (ref 96–106)
GFR calc Af Amer: 82 mL/min/{1.73_m2} (ref >59)
GFR, EST NON AFRICAN AMERICAN: 71 mL/min/{1.73_m2} (ref >59)
GLOBULIN, TOTAL: 2.5 g/dL (ref 1.5–4.5)
GLUCOSE: 84 mg/dL (ref 65–99)
Potassium: 4.4 mmol/L (ref 3.5–5.2)
SODIUM: 141 mmol/L (ref 134–144)
Total Protein: 7.1 g/dL (ref 6.0–8.5)

## 2018-05-25 LAB — CBC WITH DIFFERENTIAL/PLATELET
BASOS ABS: 0 10*3/uL (ref 0.0–0.2)
Basos: 1 %
EOS (ABSOLUTE): 0.2 10*3/uL (ref 0.0–0.4)
EOS: 4 %
HEMATOCRIT: 42 % (ref 34.0–46.6)
HEMOGLOBIN: 13.7 g/dL (ref 11.1–15.9)
IMMATURE GRANULOCYTES: 0 %
Immature Grans (Abs): 0 10*3/uL (ref 0.0–0.1)
LYMPHS: 36 %
Lymphocytes Absolute: 1.4 10*3/uL (ref 0.7–3.1)
MCH: 29.7 pg (ref 26.6–33.0)
MCHC: 32.6 g/dL (ref 31.5–35.7)
MCV: 91 fL (ref 79–97)
MONOCYTES: 14 %
Monocytes Absolute: 0.6 10*3/uL (ref 0.1–0.9)
Neutrophils Absolute: 1.7 10*3/uL (ref 1.4–7.0)
Neutrophils: 45 %
Platelets: 212 10*3/uL (ref 150–450)
RBC: 4.62 x10E6/uL (ref 3.77–5.28)
RDW: 13.9 % (ref 12.3–15.4)
WBC: 3.9 10*3/uL (ref 3.4–10.8)

## 2018-05-25 LAB — HEMOGLOBIN A1C
ESTIMATED AVERAGE GLUCOSE: 103 mg/dL
HEMOGLOBIN A1C: 5.2 % (ref 4.8–5.6)

## 2018-05-25 LAB — LIPID PANEL
Chol/HDL Ratio: 2.9 ratio (ref 0.0–4.4)
Cholesterol, Total: 176 mg/dL (ref 100–199)
HDL: 61 mg/dL (ref >39)
LDL Calculated: 105 mg/dL — ABNORMAL HIGH (ref 0–99)
TRIGLYCERIDES: 50 mg/dL (ref 0–149)
VLDL CHOLESTEROL CAL: 10 mg/dL (ref 5–40)

## 2018-05-25 LAB — TSH: TSH: 1.76 u[IU]/mL (ref 0.450–4.500)

## 2018-05-25 LAB — FSH/LH
FSH: 108.3 m[IU]/mL
LH: 83.2 m[IU]/mL

## 2018-05-28 ENCOUNTER — Encounter: Payer: Self-pay | Admitting: Family Medicine

## 2018-06-12 DIAGNOSIS — M654 Radial styloid tenosynovitis [de Quervain]: Secondary | ICD-10-CM | POA: Diagnosis not present

## 2018-06-13 ENCOUNTER — Telehealth: Payer: Self-pay | Admitting: Family Medicine

## 2018-06-13 ENCOUNTER — Telehealth: Payer: Self-pay

## 2018-06-13 NOTE — Telephone Encounter (Signed)
Copied from CRM [phone]. Topic: General - Other >> Jun 04, 2018 12:33 PM Gaynelle AduPoole, Shalonda wrote: Reason for CRM: Patient is calling and requesting her Ambulatory referral to Hand Surgery (Order [id number])  to have her Xray faxed over. Please advise    Referral made 05/24/2018 - message sent to referrals to check on appt

## 2018-06-13 NOTE — Telephone Encounter (Signed)
Spoke with Monique Stephens [facility] and pt was seen on 06/12/18. They still requested I send x-rays. X-rays faxed on 8/1

## 2018-08-01 DIAGNOSIS — M654 Radial styloid tenosynovitis [de Quervain]: Secondary | ICD-10-CM | POA: Diagnosis not present

## 2018-08-02 DIAGNOSIS — M654 Radial styloid tenosynovitis [de Quervain]: Secondary | ICD-10-CM | POA: Diagnosis not present

## 2018-08-14 DIAGNOSIS — M654 Radial styloid tenosynovitis [de Quervain]: Secondary | ICD-10-CM | POA: Diagnosis not present

## 2018-08-20 DIAGNOSIS — M25631 Stiffness of right wrist, not elsewhere classified: Secondary | ICD-10-CM | POA: Diagnosis not present

## 2018-08-20 DIAGNOSIS — M25531 Pain in right wrist: Secondary | ICD-10-CM | POA: Diagnosis not present

## 2018-08-20 DIAGNOSIS — M6281 Muscle weakness (generalized): Secondary | ICD-10-CM | POA: Diagnosis not present

## 2018-08-20 DIAGNOSIS — M654 Radial styloid tenosynovitis [de Quervain]: Secondary | ICD-10-CM | POA: Diagnosis not present

## 2018-08-26 DIAGNOSIS — Z01419 Encounter for gynecological examination (general) (routine) without abnormal findings: Secondary | ICD-10-CM | POA: Diagnosis not present

## 2018-08-26 DIAGNOSIS — Z1389 Encounter for screening for other disorder: Secondary | ICD-10-CM | POA: Diagnosis not present

## 2018-08-26 DIAGNOSIS — Z130 Encounter for screening for diseases of the blood and blood-forming organs and certain disorders involving the immune mechanism: Secondary | ICD-10-CM | POA: Diagnosis not present

## 2018-08-26 DIAGNOSIS — N941 Unspecified dyspareunia: Secondary | ICD-10-CM | POA: Diagnosis not present

## 2018-08-29 DIAGNOSIS — M6281 Muscle weakness (generalized): Secondary | ICD-10-CM | POA: Diagnosis not present

## 2018-08-29 DIAGNOSIS — M25631 Stiffness of right wrist, not elsewhere classified: Secondary | ICD-10-CM | POA: Diagnosis not present

## 2018-08-29 DIAGNOSIS — M25531 Pain in right wrist: Secondary | ICD-10-CM | POA: Diagnosis not present

## 2018-08-29 DIAGNOSIS — M654 Radial styloid tenosynovitis [de Quervain]: Secondary | ICD-10-CM | POA: Diagnosis not present

## 2018-09-06 DIAGNOSIS — M654 Radial styloid tenosynovitis [de Quervain]: Secondary | ICD-10-CM | POA: Diagnosis not present

## 2018-09-06 DIAGNOSIS — M25631 Stiffness of right wrist, not elsewhere classified: Secondary | ICD-10-CM | POA: Diagnosis not present

## 2018-09-06 DIAGNOSIS — M25531 Pain in right wrist: Secondary | ICD-10-CM | POA: Diagnosis not present

## 2018-09-06 DIAGNOSIS — M6281 Muscle weakness (generalized): Secondary | ICD-10-CM | POA: Diagnosis not present

## 2018-09-11 DIAGNOSIS — M654 Radial styloid tenosynovitis [de Quervain]: Secondary | ICD-10-CM | POA: Diagnosis not present

## 2018-10-16 DIAGNOSIS — M654 Radial styloid tenosynovitis [de Quervain]: Secondary | ICD-10-CM | POA: Diagnosis not present

## 2019-04-10 ENCOUNTER — Encounter: Payer: Self-pay | Admitting: Family Medicine

## 2019-04-10 ENCOUNTER — Telehealth (INDEPENDENT_AMBULATORY_CARE_PROVIDER_SITE_OTHER): Payer: BLUE CROSS/BLUE SHIELD | Admitting: Family Medicine

## 2019-04-10 VITALS — Ht 61.0 in | Wt 110.0 lb

## 2019-04-10 DIAGNOSIS — J301 Allergic rhinitis due to pollen: Secondary | ICD-10-CM | POA: Diagnosis not present

## 2019-04-10 DIAGNOSIS — B001 Herpesviral vesicular dermatitis: Secondary | ICD-10-CM | POA: Diagnosis not present

## 2019-04-10 MED ORDER — FLUTICASONE PROPIONATE 50 MCG/ACT NA SUSP: 2.0000 | Freq: Every day | NASAL | 3 refills | Status: AC

## 2019-04-10 MED ORDER — VALACYCLOVIR HCL 1 G PO TABS: ORAL_TABLET | ORAL | 2 refills | Status: AC

## 2019-04-10 MED ORDER — LORATADINE 10 MG PO TABS: 10.0000 mg | ORAL_TABLET | Freq: Every day | ORAL | 3 refills | Status: AC

## 2019-04-10 NOTE — Progress Notes (Signed)
Medication refill

## 2019-04-10 NOTE — Progress Notes (Signed)
Telemedicine Encounter- SOAP NOTE Established Patient  I discussed the limitations, risks, security and privacy concerns of performing an evaluation and management service by telephone and the availability of in person appointments. I also discussed with the patient that there may be a patient responsible charge related to this service. The patient expressed understanding and agreed to proceed.  This telephone encounter was conducted with the patient's  verbal consent via audio telecommunications: yes Patient was instructed to have this encounter in a suitably private space; and to only have persons present to whom they give permission to participate. In addition, patient identity was confirmed by use of name plus two identifiers (DOB and address).  I spent a total of 5min talking with the patient  Cc:  Allergies-needs flonase and zyrtec refilled Cold sores-needs valtrex refilled  Subjective   Monique Stephens is a 53 y.o. female stablished patient. Telephone visit today for refills  HPI Allergies-takes meds daily-claritin/flonase Fever blisters-valtrex prn  Patient Active Problem List   Diagnosis Date Noted  . Nocturia 04/06/2017  . Primary insomnia 03/11/2017  . Chronic seasonal allergic rhinitis due to pollen 03/11/2017  . Fever blister   . Kidney stones, mixed calcium oxalate 07/30/2012    Past Medical History:  Diagnosis Date  . Allergy    Claritin  . Blood transfusion without reported diagnosis 2001  . Fever blister     Current Outpatient Medications  Medication Sig Dispense Refill  . fluticasone (FLONASE) 50 MCG/ACT nasal spray Place 2 sprays into both nostrils daily. 48 g 3  . loratadine (CLARITIN) 10 MG tablet Take 1 tablet (10 mg total) by mouth daily. Needs appointment- annual exam due 90 tablet 3  . valACYclovir (VALTREX) 1000 MG tablet TAKE TWO TABLETS BY MOUTH TWICE DAILY FOR 2 DOSES PRN 30 tablet 2   No current facility-administered medications for  this visit.     Allergies  Allergen Reactions  . Atropine Hives  . Caffeine Other (See Comments)    Shakes; hot/cold flashes;dizziness  . Morphine And Related Itching    Social History   Socioeconomic History  . Marital status: Married    Spouse name: n/a  . Number of children: 2  . Years of education: 4712  . Highest education level: Not on file  Occupational History  . Occupation: Engineering geologistupervisor Teller    Employer: [employer]  Social Needs  . Financial resource strain: Not on file  . Food insecurity:    Worry: Not on file    Inability: Not on file  . Transportation needs:    Medical: Not on file    Non-medical: Not on file  Tobacco Use  . Smoking status: Never Smoker  . Smokeless tobacco: Never Used  Substance and Sexual Activity  . Alcohol use: Yes    Comment: occasionally  . Drug use: No  . Sexual activity: Yes    Partners: Male    Birth control/protection: Surgical  Lifestyle  . Physical activity:    Days per week: Not on file    Minutes per session: Not on file  . Stress: Not on file  Relationships  . Social connections:    Talks on phone: Not on file    Gets together: Not on file    Attends religious service: Not on file    Active member of club or organization: Not on file    Attends meetings of clubs or organizations: Not on file    Relationship status: Not on file  .  Intimate partner violence:    Fear of current or ex partner: Not on file    Emotionally abused: Not on file    Physically abused: Not on file    Forced sexual activity: Not on file  Other Topics Concern  . Not on file  Social History Narrative   Marital status: married in 2018; second marriage      Children: 2 children (25, 65); no grandchildren      Lives: with husband      Employment: first Higher education careers adviser x 33 years      Alcohol: socially; not weekly.      Exercise: 2-3 days per week; one hour with personal trainer.    Drinks 1 cup of coffee 5 days a week, occasional  soda     Review of Systems  Constitutional: Negative for chills and fever.  HENT: Positive for congestion.   Respiratory: Negative for cough and sputum production.   Cardiovascular: Negative for chest pain.  Gastrointestinal: Negative for heartburn.  no current outbreak  Objective   Vitals as reported by the patient: Today's Vitals   04/10/19 1418  Weight: 110 lb (49.9 kg)  Height: 5\' 1"  (1.549 m)   1. Chronic seasonal allergic rhinitis due to pollen Claritin, flonase-rx risk/benefit/side effects d/w pt  2. Fever blister No current concerns-needs meds for future outbreak  I discussed the assessment and treatment plan with the patient. The patient was provided an opportunity to ask questions and all were answered. The patient agreed with the plan and demonstrated an understanding of the instructions.   The patient was advised to call back  To schedule routine appt for annual exam and mammogram  nI provided 5 minutes of non-face-to-face time during this encounter.  Monique Pagett Mat Carne, MD  Primary Care at [facility] 04-10-19

## 2019-05-19 ENCOUNTER — Other Ambulatory Visit: Payer: Self-pay

## 2019-05-19 ENCOUNTER — Encounter: Payer: Self-pay | Admitting: Family Medicine

## 2019-05-19 ENCOUNTER — Ambulatory Visit: Payer: BLUE CROSS/BLUE SHIELD | Admitting: Family Medicine

## 2019-05-19 VITALS — BP 138/88 | HR 92 | Temp 98.9°F | Ht 61.0 in | Wt 111.0 lb

## 2019-05-19 DIAGNOSIS — Z7189 Other specified counseling: Secondary | ICD-10-CM

## 2019-05-19 DIAGNOSIS — F411 Generalized anxiety disorder: Secondary | ICD-10-CM

## 2019-05-19 NOTE — Progress Notes (Signed)
7/6/20208:30 AM  Monique GamblesKaren Marie Stephens Mar 30, 1966, 53 y.o., female [id number]  Chief Complaint  Patient presents with  . Letter for School/Work    needing a work note to be excused from wearing mask due to panic attacks and sob    HPI:   Patient is a 53 y.o. female who presents today requesting medical excuse for wearing mask at work  Works in a bank, Engineer, materialsteller, open to [employer] Struggling with wearing mask, which she needs to do for work Reports claustrophobia, irritability and SOB   [facility] screen [facility] 04/10/2019 05/24/2018 04/03/2018  Decreased Interest 0 0 0  Down, Depressed, Hopeless 0 0 0  PHQ - 2 Score 0 0 0    Fall Risk  05/19/2019 04/10/2019 05/24/2018 04/03/2018 02/16/2018  Falls in the past year? 1 0 No No No  Number falls in past yr: 0 0 - - -  Injury with Fall? 0 0 - - -     Allergies  Allergen Reactions  . Atropine Hives  . Caffeine Other (See Comments)    Shakes; hot/cold flashes;dizziness  . Morphine And Related Itching    Prior to Admission medications   Medication Sig Start Date End Date Taking? Authorizing Provider  fluticasone (FLONASE) 50 MCG/ACT nasal spray Place 2 sprays into both nostrils daily. 04/10/19  Yes Corum, Minerva FesterLisa L, MD  loratadine (CLARITIN) 10 MG tablet Take 1 tablet (10 mg total) by mouth daily. Needs appointment- annual exam due 04/10/19  Yes Corum, Minerva FesterLisa L, MD  valACYclovir (VALTREX) 1000 MG tablet TAKE TWO TABLETS BY MOUTH TWICE DAILY FOR 2 DOSES PRN 04/10/19  Yes Corum, Minerva FesterLisa L, MD    Past Medical History:  Diagnosis Date  . Allergy    Claritin  . Blood transfusion without reported diagnosis 2001  . Fever blister     Past Surgical History:  Procedure Laterality Date  . ABDOMINAL HYSTERECTOMY     DUB.  ovaries intact.    Marland Kitchen. BREAST SURGERY     implants B  . CESAREAN SECTION    . COSMETIC SURGERY     tummy tucks, bilateral breast implants  . HERNIA REPAIR  1969   Bilateral; age 53 year old.  . TUBAL LIGATION      Social  History   Tobacco Use  . Smoking status: Never Smoker  . Smokeless tobacco: Never Used  Substance Use Topics  . Alcohol use: Yes    Comment: occasionally    Family History  Problem Relation Age of Onset  . Cancer Mother 261       Non-Hodgkin's Lymphoma  . Alzheimer's disease Father   . Hyperlipidemia Father   . Heart disease Father        not sure    ROS Per hpi  OBJECTIVE:  Today's Vitals   05/19/19 0820  BP: 138/88  Pulse: 92  Temp: 98.9 F (37.2 C)  TempSrc: Oral  SpO2: 100%  Weight: 111 lb (50.3 kg)  Height: 5\' 1"  (1.549 m)   Body mass index is 20.97 kg/m.   Physical Exam Vitals signs and nursing note reviewed.  Constitutional:      Appearance: She is well-developed.  HENT:     Head: Normocephalic and atraumatic.  Eyes:     General: No scleral icterus.    Conjunctiva/sclera: Conjunctivae normal.     Pupils: Pupils are equal, round, and reactive to light.  Neck:     Musculoskeletal: Neck supple.  Pulmonary:     Effort: Pulmonary  effort is normal.  Skin:    General: Skin is warm and dry.  Neurological:     Mental Status: She is alert and oriented to person, place, and time.      ASSESSMENT and PLAN  1. Anxiety state 2. Educated About Covid-19 Virus Infection  Discussed with patient that while I was happy to work with her anxiety around using a mask, I would not excuse her from wearing one as there is no medical reason that outweighs risk. Patient was not happy and stormed out of the exam room  No follow-ups on file.    Rutherford Guys, MD Primary Care at [place], [place] [address] Ph.  [phone] Fax [phone]

## 2019-05-19 NOTE — Patient Instructions (Signed)
° ° ° °  If you have lab work done today you will be contacted with your lab results within the next 2 weeks.  If you have not heard from us then please contact us. The fastest way to get your results is to register for My Chart. ° ° °IF you received an x-ray today, you will receive an invoice from [facility]. Please contact  Radiology at [phone] with questions or concerns regarding your invoice.  ° °IF you received labwork today, you will receive an invoice from [facility]. Please contact [facility] at [phone] with questions or concerns regarding your invoice.  ° °Our billing staff will not be able to assist you with questions regarding bills from these companies. ° °You will be contacted with the lab results as soon as they are available. The fastest way to get your results is to activate your My Chart account. Instructions are located on the last page of this paperwork. If you have not heard from us regarding the results in 2 weeks, please contact this office. °  ° ° ° °

## 2019-05-20 DIAGNOSIS — Z803 Family history of malignant neoplasm of breast: Secondary | ICD-10-CM | POA: Diagnosis not present

## 2019-05-20 DIAGNOSIS — Z1231 Encounter for screening mammogram for malignant neoplasm of breast: Secondary | ICD-10-CM | POA: Diagnosis not present

## 2019-06-04 DIAGNOSIS — M9901 Segmental and somatic dysfunction of cervical region: Secondary | ICD-10-CM | POA: Diagnosis not present

## 2019-06-04 DIAGNOSIS — M9902 Segmental and somatic dysfunction of thoracic region: Secondary | ICD-10-CM | POA: Diagnosis not present

## 2019-06-04 DIAGNOSIS — M47817 Spondylosis without myelopathy or radiculopathy, lumbosacral region: Secondary | ICD-10-CM | POA: Diagnosis not present

## 2019-06-04 DIAGNOSIS — M5032 Other cervical disc degeneration, mid-cervical region, unspecified level: Secondary | ICD-10-CM | POA: Diagnosis not present

## 2019-06-17 ENCOUNTER — Telehealth: Payer: Self-pay | Admitting: Family Medicine

## 2019-06-17 NOTE — Telephone Encounter (Signed)
Please call Lillyan Hitson at [phone] . Regarding an issue  From first part of July. And refund . FR

## 2019-08-26 DIAGNOSIS — H35463 Secondary vitreoretinal degeneration, bilateral: Secondary | ICD-10-CM | POA: Diagnosis not present

## 2019-08-26 DIAGNOSIS — H2513 Age-related nuclear cataract, bilateral: Secondary | ICD-10-CM | POA: Diagnosis not present

## 2019-08-26 DIAGNOSIS — H43813 Vitreous degeneration, bilateral: Secondary | ICD-10-CM | POA: Diagnosis not present

## 2019-08-26 DIAGNOSIS — H40013 Open angle with borderline findings, low risk, bilateral: Secondary | ICD-10-CM | POA: Diagnosis not present

## 2019-08-26 DIAGNOSIS — H52211 Irregular astigmatism, right eye: Secondary | ICD-10-CM | POA: Diagnosis not present

## 2019-09-22 DIAGNOSIS — E669 Obesity, unspecified: Secondary | ICD-10-CM | POA: Diagnosis not present

## 2019-09-22 DIAGNOSIS — Z130 Encounter for screening for diseases of the blood and blood-forming organs and certain disorders involving the immune mechanism: Secondary | ICD-10-CM | POA: Diagnosis not present

## 2019-09-22 DIAGNOSIS — Z01419 Encounter for gynecological examination (general) (routine) without abnormal findings: Secondary | ICD-10-CM | POA: Diagnosis not present

## 2019-09-22 DIAGNOSIS — Z1389 Encounter for screening for other disorder: Secondary | ICD-10-CM | POA: Diagnosis not present

## 2019-09-23 DIAGNOSIS — R002 Palpitations: Secondary | ICD-10-CM | POA: Diagnosis not present

## 2019-09-23 DIAGNOSIS — Z13220 Encounter for screening for lipoid disorders: Secondary | ICD-10-CM | POA: Diagnosis not present

## 2019-09-23 DIAGNOSIS — Z1211 Encounter for screening for malignant neoplasm of colon: Secondary | ICD-10-CM | POA: Diagnosis not present

## 2019-09-23 DIAGNOSIS — Z6821 Body mass index (BMI) 21.0-21.9, adult: Secondary | ICD-10-CM | POA: Diagnosis not present

## 2019-09-23 DIAGNOSIS — Z0000 Encounter for general adult medical examination without abnormal findings: Secondary | ICD-10-CM | POA: Diagnosis not present

## 2019-11-04 ENCOUNTER — Emergency Department (HOSPITAL_COMMUNITY)
Admission: EM | Admit: 2019-11-04 | Discharge: 2019-11-04 | Disposition: A | Discharge status: Home / Self Care | Payer: BC Managed Care – PPO | Attending: Emergency Medicine | Admitting: Emergency Medicine

## 2019-11-04 ENCOUNTER — Other Ambulatory Visit: Payer: Self-pay

## 2019-11-04 ENCOUNTER — Emergency Department (HOSPITAL_COMMUNITY): Payer: BC Managed Care – PPO

## 2019-11-04 DIAGNOSIS — Z79899 Other long term (current) drug therapy: Secondary | ICD-10-CM | POA: Diagnosis not present

## 2019-11-04 DIAGNOSIS — R030 Elevated blood-pressure reading, without diagnosis of hypertension: Secondary | ICD-10-CM | POA: Diagnosis not present

## 2019-11-04 DIAGNOSIS — R072 Precordial pain: Secondary | ICD-10-CM | POA: Diagnosis not present

## 2019-11-04 DIAGNOSIS — R079 Chest pain, unspecified: Secondary | ICD-10-CM | POA: Diagnosis not present

## 2019-11-04 LAB — BASIC METABOLIC PANEL
Anion gap: 11 (ref 5–15)
BUN: 12 mg/dL (ref 6–20)
CO2: 27 mmol/L (ref 22–32)
Calcium: 9.5 mg/dL (ref 8.9–10.3)
Chloride: 103 mmol/L (ref 98–111)
Creatinine, Ser: 0.87 mg/dL (ref 0.44–1.00)
GFR calc Af Amer: >60 mL/min (ref >60)
GFR calc non Af Amer: >60 mL/min (ref >60)
Glucose, Bld: 91 mg/dL (ref 70–99)
Potassium: 4.1 mmol/L (ref 3.5–5.1)
Sodium: 141 mmol/L (ref 135–145)

## 2019-11-04 LAB — CBC
HCT: 42.1 % (ref 36.0–46.0)
Hemoglobin: 13.6 g/dL (ref 12.0–15.0)
MCH: 29.6 pg (ref 26.0–34.0)
MCHC: 32.3 g/dL (ref 30.0–36.0)
MCV: 91.7 fL (ref 80.0–100.0)
Platelets: 233 10*3/uL (ref 150–400)
RBC: 4.59 MIL/uL (ref 3.87–5.11)
RDW: 13.4 % (ref 11.5–15.5)
WBC: 5.8 10*3/uL (ref 4.0–10.5)
nRBC: 0 % (ref 0.0–0.2)

## 2019-11-04 LAB — D-DIMER, QUANTITATIVE: D-Dimer, Quant: <0.27 ug/mL-FEU (ref 0.00–0.50)

## 2019-11-04 LAB — TROPONIN I (HIGH SENSITIVITY)
Troponin I (High Sensitivity): 3 ng/L (ref <18)
Troponin I (High Sensitivity): 4 ng/L (ref <18)

## 2019-11-04 MED ORDER — SODIUM CHLORIDE 0.9% FLUSH
3.0000 mL | Freq: Once | INTRAVENOUS | Status: AC
  Administered 2019-11-04: 14:00:00 3 mL via INTRAVENOUS

## 2019-11-04 MED ORDER — NITROGLYCERIN 0.4 MG SL SUBL: 0.4000 mg | SUBLINGUAL_TABLET | SUBLINGUAL | Status: DC | PRN

## 2019-11-04 MED ORDER — SODIUM CHLORIDE 0.9 % IV BOLUS
1000.0000 mL | Freq: Once | INTRAVENOUS | Status: AC
  Administered 2019-11-04: 1000 mL via INTRAVENOUS

## 2019-11-04 MED ORDER — ASPIRIN 81 MG PO CHEW
324.0000 mg | CHEWABLE_TABLET | Freq: Once | ORAL | Status: AC
  Administered 2019-11-04: 324 mg via ORAL
  Filled 2019-11-04: qty 4

## 2019-11-04 NOTE — Discharge Instructions (Addendum)
Your work-up today was overall reassuring although your blood pressure was elevated as we discussed.  Please continue to document your blood pressure readings at home and follow-up with your PCP and a cardiologist in the next week.  If any symptoms change or worsen acutely, please return to the nearest emergency department.  Please rest and stay hydrated.

## 2019-11-04 NOTE — ED Provider Notes (Signed)
3:38 PM Care assumed from Dr. Regenia Skeeter.  At time of transfer care patient is awaiting D-dimer and delta troponin for her chest pain.  5:04 PM D-dimer is negative.  Delta troponin is negative.  Patient does have blood pressures that are between 371 and 696 systolic although she does not carry a diagnosis of hypertension.  Had a long discussion with patient and we agreed to have her follow-up with her PCP and cardiology for further management of discomfort as well as for consideration and initiation of blood pressure medication.  Patient agrees with discharge with close follow-up as well as return precautions.  She had no other questions or concerns and was discharged in good condition.    Clinical Impression: 1. Precordial pain   2. Elevated blood pressure reading     Disposition: Discharge  Condition: Good  I have discussed the results, Dx and Tx plan with the pt(& family if present). He/she/they expressed understanding and agree(s) with the plan. Discharge instructions discussed at great length. Strict return precautions discussed and pt &/or family have verbalized understanding of the instructions. No further questions at time of discharge.    New Prescriptions   No medications on file    Follow Up: Rutherford Guys, MD [address] [place] [address] [phone]  Schedule an appointment as soon as possible for a visit    [place] [id number] [phone] Schedule an appointment as soon as possible for a visit       Zein Helbing, Gwenyth Allegra, MD 11/05/19 [phone]

## 2019-11-04 NOTE — ED Provider Notes (Signed)
[facility] Provider Note   CSN: [id number] Arrival date & time: 11/04/19  1247     History Chief Complaint  Patient presents with  . Chest Pain    Monique GamblesKaren Marie Stephens is a 53 y.o. female.  HPI 53 year old female presents with chest pain.  On and off over the last week.  Chest feels tight as well as tightness under her left shoulder and arm some pain at the base of her neck.  Last night she had some upper abdominal pain and nausea but that transiently resolved.  She has been checking her blood pressure has been high, 140+/80+.  No known history of hypertension.  No hyperlipidemia, diabetes or smoking.  Her dad had heart disease but she is not sure at what age.  Currently the pain is not that bad.  It is been on and off since it started but today has been constant.  Feels some palpitations and lightheadedness. Nothing induces the chest pain such as deep inspiration, movement/position or exertion.  Past Medical History:  Diagnosis Date  . Allergy    Claritin  . Blood transfusion without reported diagnosis 2001  . Fever blister     Patient Active Problem List   Diagnosis Date Noted  . Nocturia 04/06/2017  . Primary insomnia 03/11/2017  . Chronic seasonal allergic rhinitis due to pollen 03/11/2017  . Fever blister   . Kidney stones, mixed calcium oxalate 07/30/2012    Past Surgical History:  Procedure Laterality Date  . ABDOMINAL HYSTERECTOMY     DUB.  ovaries intact.    Marland Kitchen. BREAST SURGERY     implants B  . CESAREAN SECTION    . COSMETIC SURGERY     tummy tucks, bilateral breast implants  . HERNIA REPAIR  1969   Bilateral; age [age] year old.  . TUBAL LIGATION       OB History   No obstetric history on file.     Family History  Problem Relation Age of Onset  . Cancer Mother [id number]       Non-Hodgkin's Lymphoma  . Alzheimer's disease Father   . Hyperlipidemia Father   . Heart disease Father        not sure    Social History    Tobacco Use  . Smoking status: Never Smoker  . Smokeless tobacco: Never Used  Substance Use Topics  . Alcohol use: Yes    Comment: occasionally  . Drug use: No    Home Medications Prior to Admission medications   Medication Sig Start Date End Date Taking? Authorizing Provider  fluticasone (FLONASE) 50 MCG/ACT nasal spray Place 2 sprays into both nostrils daily. 04/10/19   Corum, Minerva FesterLisa L, MD  loratadine (CLARITIN) 10 MG tablet Take 1 tablet (10 mg total) by mouth daily. Needs appointment- annual exam due 04/10/19   Wandra Feinsteinorum, Lisa L, MD  valACYclovir (VALTREX) 1000 MG tablet TAKE TWO TABLETS BY MOUTH TWICE DAILY FOR 2 DOSES PRN 04/10/19   Wandra Feinsteinorum, Lisa L, MD    Allergies    Atropine, Caffeine, and Morphine and related  Review of Systems   Review of Systems  Respiratory: Negative for shortness of breath.   Cardiovascular: Positive for chest pain. Negative for leg swelling.  Gastrointestinal: Positive for abdominal pain and nausea. Negative for vomiting.  Musculoskeletal: Positive for back pain.  Neurological: Positive for light-headedness.  All other systems reviewed and are negative.   Physical Exam Updated Vital Signs BP (!) 146/90   Pulse 71  Temp 98 F (36.7 C) (Oral)   Resp 15   SpO2 100%   Physical Exam Vitals and nursing note reviewed.  Constitutional:      Appearance: She is well-developed.  HENT:     Head: Normocephalic and atraumatic.     Right Ear: External ear normal.     Left Ear: External ear normal.     Nose: Nose normal.  Eyes:     General:        Right eye: No discharge.        Left eye: No discharge.  Cardiovascular:     Rate and Rhythm: Normal rate and regular rhythm.     Heart sounds: Normal heart sounds.     Comments: HR~100 Pulmonary:     Effort: Pulmonary effort is normal.     Breath sounds: Normal breath sounds.  Abdominal:     Palpations: Abdomen is soft.     Tenderness: There is no abdominal tenderness.  Musculoskeletal:     Cervical  back: No tenderness.     Thoracic back: No tenderness.     Right lower leg: No edema.     Left lower leg: No edema.  Skin:    General: Skin is warm and dry.  Neurological:     Mental Status: She is alert.  Psychiatric:        Mood and Affect: Mood is not anxious.     ED Results / Procedures / Treatments   Labs (all labs ordered are listed, but only abnormal results are displayed) Labs Reviewed  BASIC METABOLIC PANEL  CBC  D-DIMER, QUANTITATIVE (NOT AT [facility])  TROPONIN I (HIGH SENSITIVITY)  TROPONIN I (HIGH SENSITIVITY)    EKG EKG Interpretation  Date/Time:  Tuesday November 04 2019 12:57:50 EST Ventricular Rate:  102 PR Interval:  122 QRS Duration: 80 QT Interval:  356 QTC Calculation: 463 R Axis:   82 Text Interpretation: Sinus tachycardia no acute ST/T changes No old tracing to compare Confirmed by Pricilla Loveless [phone]) on 11/04/2019 1:06:44 PM   Radiology DG Chest 2 View  Result Date: 11/04/2019 CLINICAL DATA:  Chest pain EXAM: CHEST - 2 VIEW COMPARISON:  None. FINDINGS: The heart size and mediastinal contours are within normal limits. Both lungs are clear. No pleural effusion or pneumothorax. The visualized skeletal structures are unremarkable. IMPRESSION: No acute process in the chest. Electronically Signed   By: Guadlupe Spanish M.D.   On: 11/04/2019 13:17    Procedures Procedures (including critical care time)  Medications Ordered in ED Medications  nitroGLYCERIN (NITROSTAT) SL tablet 0.4 mg (has no administration in time range)  sodium chloride flush (NS) 0.9 % injection 3 mL (3 mLs Intravenous Given 11/04/19 1419)  sodium chloride 0.9 % bolus 1,000 mL (1,000 mLs Intravenous New Bag/Given 11/04/19 1419)  aspirin chewable tablet 324 mg (324 mg Oral Given 11/04/19 1401)    ED Course  I have reviewed the triage vital signs and the nursing notes.  Pertinent labs & imaging results that were available during my care of the patient were reviewed by me and  considered in my medical decision making (see chart for details).    MDM Rules/Calculators/A&P         HEART Score: 2             Patient's chest pain is atypical.  Given her mild tachycardia, D-dimer will be added onto her chest pain work-up though she is otherwise low risk for PE.  Initial troponin negative.  Besides  tachycardia, ECG is benign.  Heart score is a 2.  I think if her D-dimer and second troponin is negative, she could likely be discharged home to follow-up with her PCP.  Care to Dr. Sherry Ruffing. Final Clinical Impression(s) / ED Diagnoses Final diagnoses:  None    Rx / DC Orders ED Discharge Orders    None       Sherwood Gambler, MD 11/04/19 1525

## 2019-11-04 NOTE — ED Triage Notes (Signed)
Pt here for evaluation of squeezing/tight L sided chest pain with radiation to L shoulder x one week. On arrival pt sts having burning pain in L shoulder blade. Pt sts her blood pressure has been steadily increasing over the last few days, to 140s/90s. Pt sts her she feels constant fluttering in her chest. Last night pt had intense upper abdominal pain with nausea, light headedness, and dizziness.

## 2019-11-13 DIAGNOSIS — I10 Essential (primary) hypertension: Secondary | ICD-10-CM | POA: Diagnosis not present

## 2019-11-13 DIAGNOSIS — K219 Gastro-esophageal reflux disease without esophagitis: Secondary | ICD-10-CM | POA: Diagnosis not present

## 2019-12-29 DIAGNOSIS — M9901 Segmental and somatic dysfunction of cervical region: Secondary | ICD-10-CM | POA: Diagnosis not present

## 2019-12-29 DIAGNOSIS — M9902 Segmental and somatic dysfunction of thoracic region: Secondary | ICD-10-CM | POA: Diagnosis not present

## 2019-12-29 DIAGNOSIS — M47817 Spondylosis without myelopathy or radiculopathy, lumbosacral region: Secondary | ICD-10-CM | POA: Diagnosis not present

## 2019-12-29 DIAGNOSIS — M5032 Other cervical disc degeneration, mid-cervical region, unspecified level: Secondary | ICD-10-CM | POA: Diagnosis not present

## 2020-01-12 DIAGNOSIS — I10 Essential (primary) hypertension: Secondary | ICD-10-CM | POA: Diagnosis not present

## 2020-01-29 DIAGNOSIS — Z23 Encounter for immunization: Secondary | ICD-10-CM | POA: Diagnosis not present

## 2020-02-19 DIAGNOSIS — Z23 Encounter for immunization: Secondary | ICD-10-CM | POA: Diagnosis not present

## 2020-05-05 DIAGNOSIS — Z1211 Encounter for screening for malignant neoplasm of colon: Secondary | ICD-10-CM | POA: Diagnosis not present

## 2020-07-17 DIAGNOSIS — U071 COVID-19: Secondary | ICD-10-CM | POA: Diagnosis not present

## 2020-07-17 DIAGNOSIS — R05 Cough: Secondary | ICD-10-CM | POA: Diagnosis not present

## 2020-08-27 ENCOUNTER — Telehealth: Payer: Self-pay | Admitting: Family Medicine

## 2020-08-27 NOTE — Telephone Encounter (Signed)
[facility] to [facility]  Prescription for [facility] as Monique Stephens had never filled this script for her / and Corum not our provider

## 2020-09-23 DIAGNOSIS — Z0000 Encounter for general adult medical examination without abnormal findings: Secondary | ICD-10-CM | POA: Diagnosis not present

## 2020-09-23 DIAGNOSIS — Z6820 Body mass index (BMI) 20.0-20.9, adult: Secondary | ICD-10-CM | POA: Diagnosis not present

## 2020-09-23 DIAGNOSIS — Z13220 Encounter for screening for lipoid disorders: Secondary | ICD-10-CM | POA: Diagnosis not present

## 2020-09-23 DIAGNOSIS — I10 Essential (primary) hypertension: Secondary | ICD-10-CM | POA: Diagnosis not present

## 2020-09-27 DIAGNOSIS — Z01419 Encounter for gynecological examination (general) (routine) without abnormal findings: Secondary | ICD-10-CM | POA: Diagnosis not present

## 2020-09-27 DIAGNOSIS — N941 Unspecified dyspareunia: Secondary | ICD-10-CM | POA: Diagnosis not present

## 2020-09-27 DIAGNOSIS — Z130 Encounter for screening for diseases of the blood and blood-forming organs and certain disorders involving the immune mechanism: Secondary | ICD-10-CM | POA: Diagnosis not present

## 2020-09-27 DIAGNOSIS — Z1389 Encounter for screening for other disorder: Secondary | ICD-10-CM | POA: Diagnosis not present

## 2020-09-28 DIAGNOSIS — K5730 Diverticulosis of large intestine without perforation or abscess without bleeding: Secondary | ICD-10-CM | POA: Diagnosis not present

## 2020-09-28 DIAGNOSIS — Z1211 Encounter for screening for malignant neoplasm of colon: Secondary | ICD-10-CM | POA: Diagnosis not present

## 2020-10-02 DIAGNOSIS — Z1231 Encounter for screening mammogram for malignant neoplasm of breast: Secondary | ICD-10-CM | POA: Diagnosis not present

## 2021-01-01 IMAGING — CR DG CHEST 2V
2 series · 2 of 2 positions shown · non-contrast
Comparison: None.

CLINICAL DATA: Chest pain

EXAM:
CHEST - 2 VIEW

[chest pa]
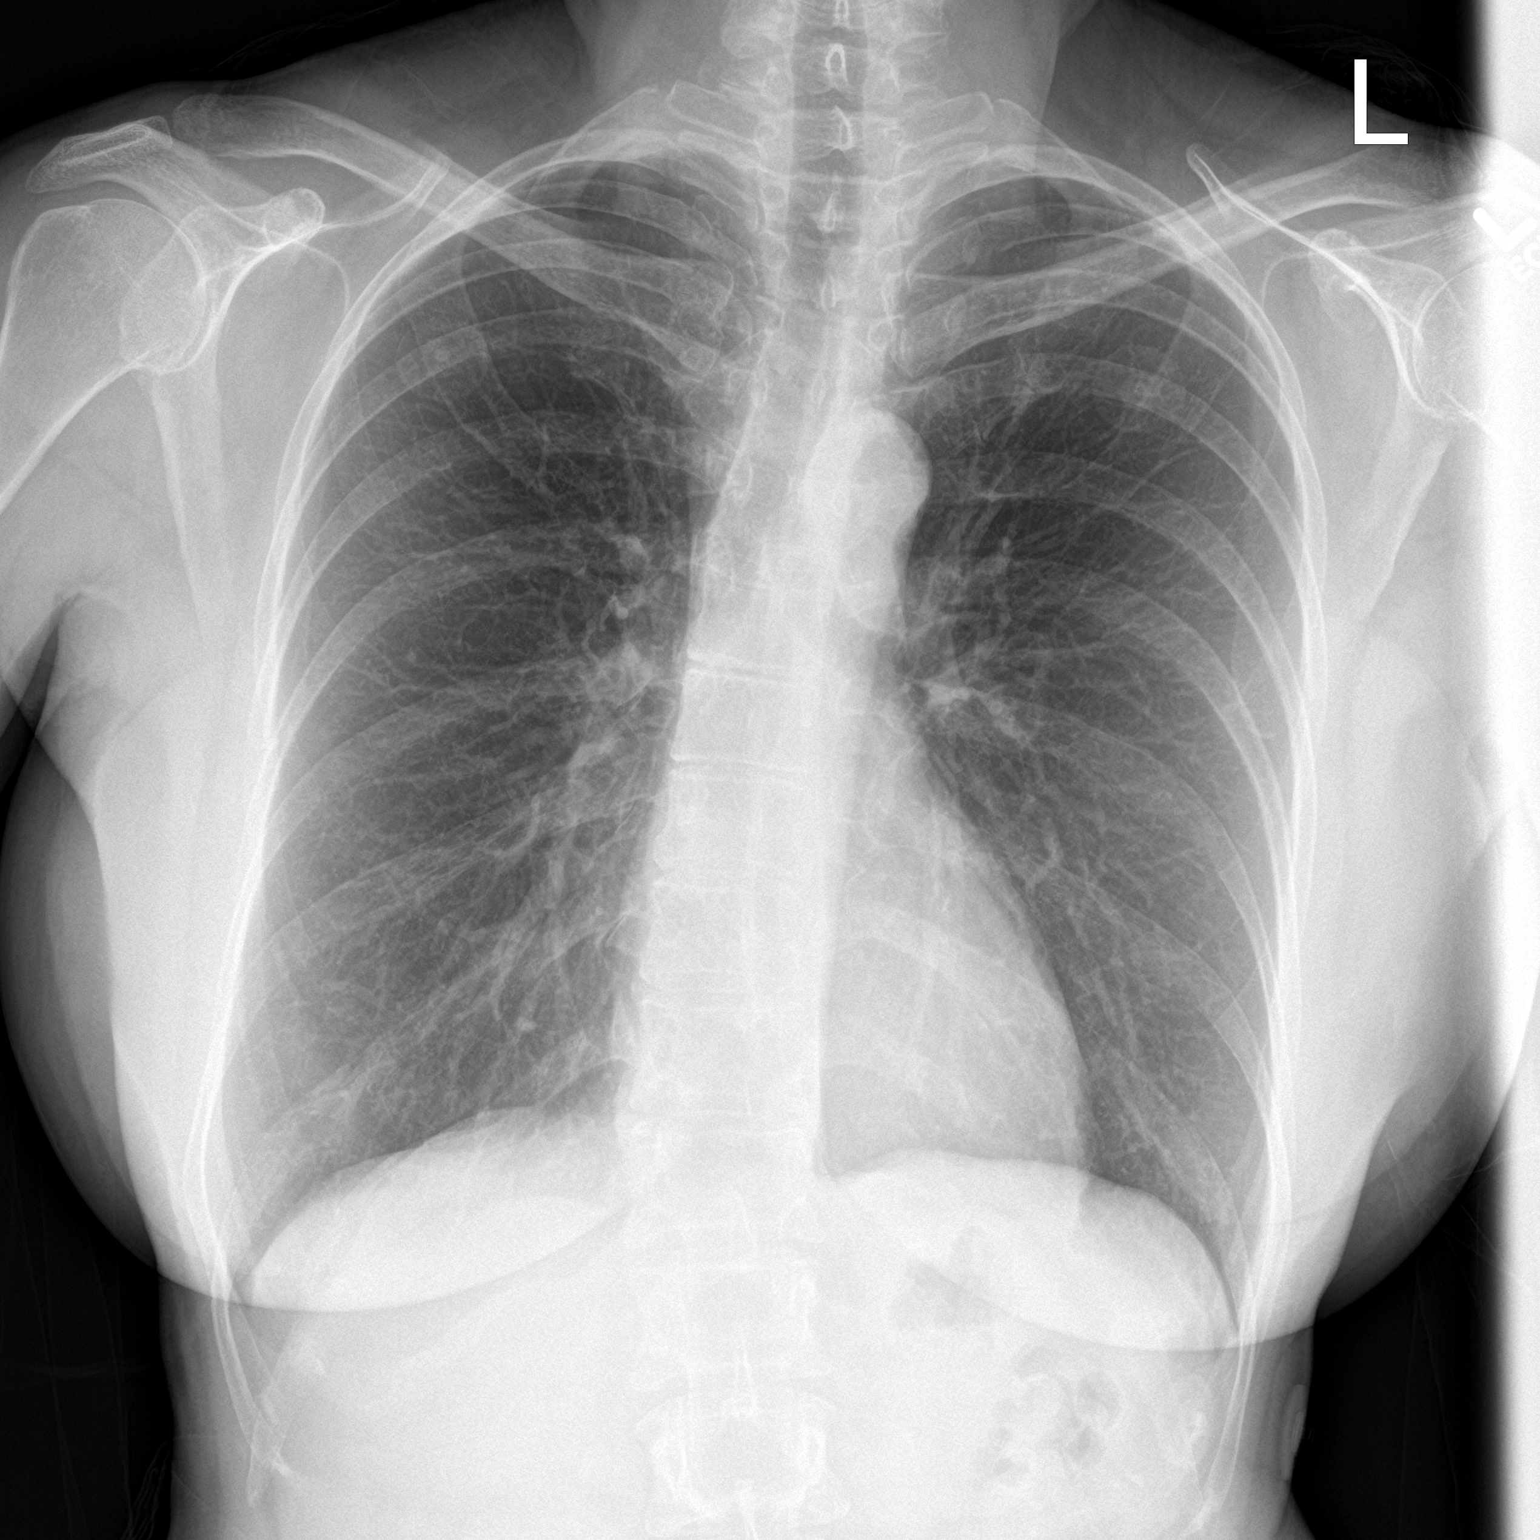

[chest lat]
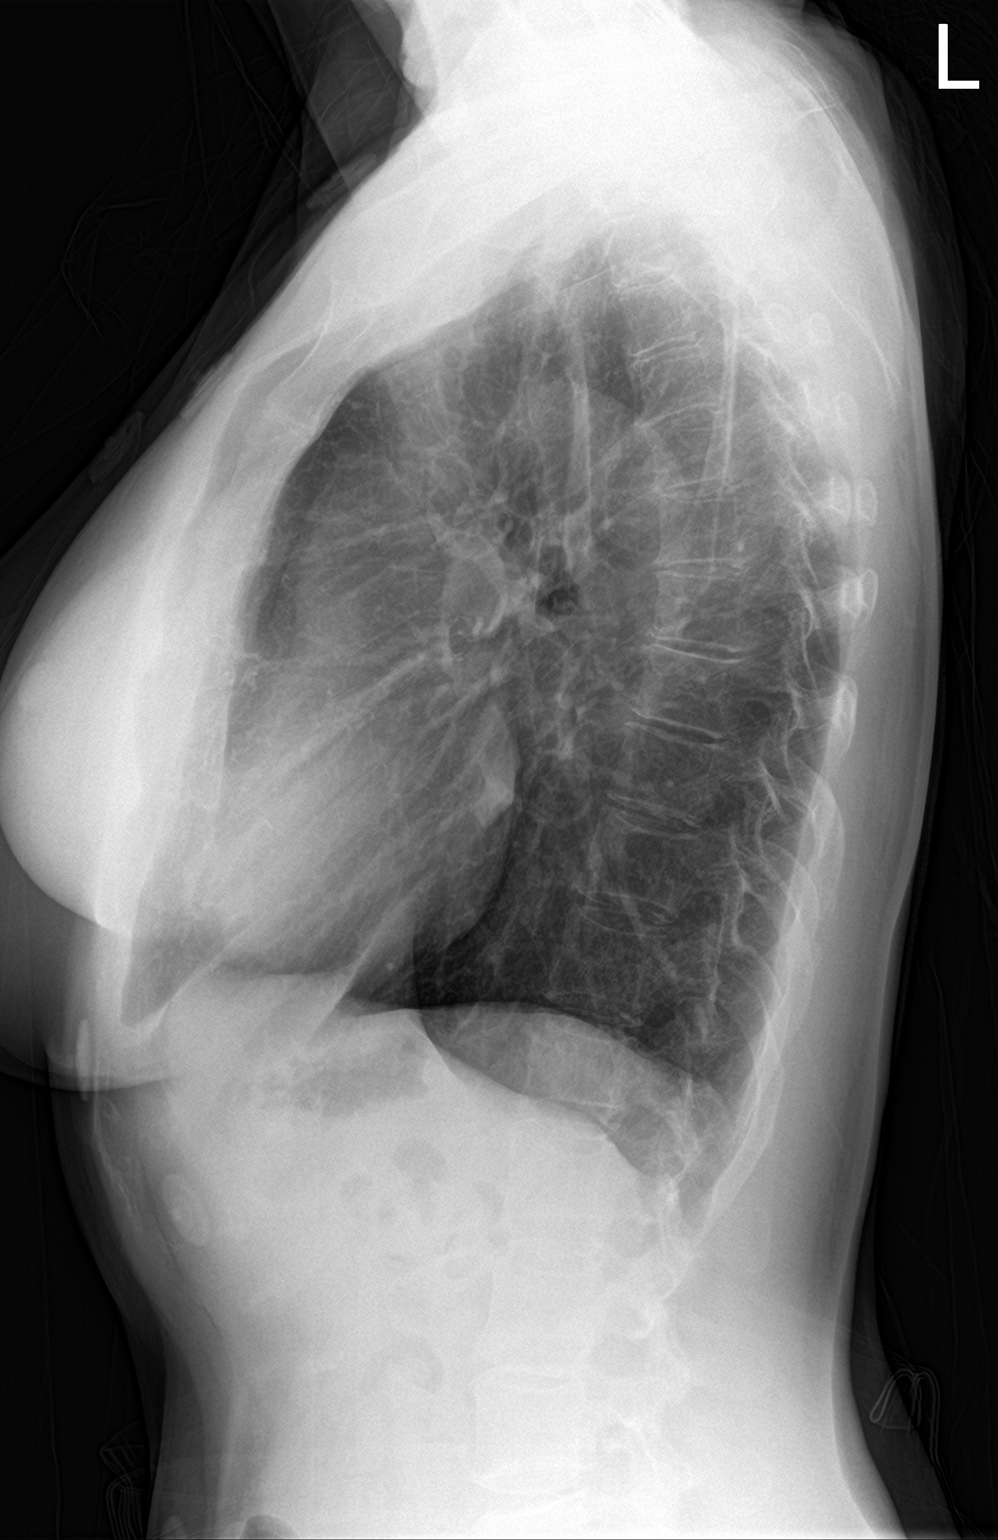

[2 of 2 positions shown; findings below may reference images not displayed]

FINDINGS: The heart size and mediastinal contours are within normal limits.
Both lungs are clear. No pleural effusion or pneumothorax. The
visualized skeletal structures are unremarkable.
IMPRESSION: No acute process in the chest.

## 2021-10-21 ENCOUNTER — Other Ambulatory Visit: Payer: Self-pay | Admitting: Physician Assistant

## 2021-10-21 DIAGNOSIS — R1310 Dysphagia, unspecified: Secondary | ICD-10-CM

## 2021-10-28 ENCOUNTER — Other Ambulatory Visit: Payer: BC Managed Care – PPO

## 2021-11-20 ENCOUNTER — Encounter (HOSPITAL_COMMUNITY): Payer: Self-pay | Admitting: Emergency Medicine

## 2021-11-20 ENCOUNTER — Emergency Department (HOSPITAL_COMMUNITY)
Admission: EM | Admit: 2021-11-20 | Discharge: 2021-11-20 | Disposition: A | Discharge status: Home / Self Care | Payer: BC Managed Care – PPO | Attending: Emergency Medicine | Admitting: Emergency Medicine

## 2021-11-20 ENCOUNTER — Other Ambulatory Visit: Payer: Self-pay

## 2021-11-20 DIAGNOSIS — R197 Diarrhea, unspecified: Secondary | ICD-10-CM | POA: Insufficient documentation

## 2021-11-20 DIAGNOSIS — Z5321 Procedure and treatment not carried out due to patient leaving prior to being seen by health care provider: Secondary | ICD-10-CM | POA: Insufficient documentation

## 2021-11-20 DIAGNOSIS — R110 Nausea: Secondary | ICD-10-CM | POA: Insufficient documentation

## 2021-11-20 DIAGNOSIS — R1010 Upper abdominal pain, unspecified: Secondary | ICD-10-CM | POA: Insufficient documentation

## 2021-11-20 NOTE — ED Triage Notes (Signed)
Patient c/o abdominal pain with nausea and diarrhea. Mid upper abdominal pain. Reports minimal oral intake today.

## 2022-09-14 ENCOUNTER — Other Ambulatory Visit: Payer: Self-pay

## 2022-09-14 ENCOUNTER — Emergency Department (HOSPITAL_BASED_OUTPATIENT_CLINIC_OR_DEPARTMENT_OTHER)
Admission: EM | Admit: 2022-09-14 | Discharge: 2022-09-14 | Disposition: A | Discharge status: Home / Self Care | Payer: BC Managed Care – PPO | Attending: Emergency Medicine | Admitting: Emergency Medicine

## 2022-09-14 ENCOUNTER — Encounter (HOSPITAL_BASED_OUTPATIENT_CLINIC_OR_DEPARTMENT_OTHER): Payer: Self-pay | Admitting: Emergency Medicine

## 2022-09-14 ENCOUNTER — Emergency Department (HOSPITAL_BASED_OUTPATIENT_CLINIC_OR_DEPARTMENT_OTHER): Payer: BC Managed Care – PPO

## 2022-09-14 DIAGNOSIS — R102 Pelvic and perineal pain: Secondary | ICD-10-CM | POA: Insufficient documentation

## 2022-09-14 LAB — URINALYSIS, ROUTINE W REFLEX MICROSCOPIC
Bilirubin Urine: NEGATIVE
Glucose, UA: NEGATIVE mg/dL
Hgb urine dipstick: NEGATIVE
Ketones, ur: NEGATIVE mg/dL
Leukocytes,Ua: NEGATIVE
Nitrite: NEGATIVE
Protein, ur: NEGATIVE mg/dL
Specific Gravity, Urine: 1.018 (ref 1.005–1.030)
pH: 7.5 (ref 5.0–8.0)

## 2022-09-14 LAB — CBC WITH DIFFERENTIAL/PLATELET
Abs Immature Granulocytes: 0.02 10*3/uL (ref 0.00–0.07)
Basophils Absolute: 0.1 10*3/uL (ref 0.0–0.1)
Basophils Relative: 1 %
Eosinophils Absolute: 0.2 10*3/uL (ref 0.0–0.5)
Eosinophils Relative: 4 %
HCT: 40.1 % (ref 36.0–46.0)
Hemoglobin: 12.6 g/dL (ref 12.0–15.0)
Immature Granulocytes: 0 %
Lymphocytes Relative: 29 %
Lymphs Abs: 1.8 10*3/uL (ref 0.7–4.0)
MCH: 29.4 pg (ref 26.0–34.0)
MCHC: 31.4 g/dL (ref 30.0–36.0)
MCV: 93.5 fL (ref 80.0–100.0)
Monocytes Absolute: 0.5 10*3/uL (ref 0.1–1.0)
Monocytes Relative: 8 %
Neutro Abs: 3.5 10*3/uL (ref 1.7–7.7)
Neutrophils Relative %: 58 %
Platelets: 238 10*3/uL (ref 150–400)
RBC: 4.29 MIL/uL (ref 3.87–5.11)
RDW: 14 % (ref 11.5–15.5)
WBC: 6.2 10*3/uL (ref 4.0–10.5)
nRBC: 0 % (ref 0.0–0.2)

## 2022-09-14 LAB — COMPREHENSIVE METABOLIC PANEL
ALT: 14 U/L (ref 0–44)
AST: 18 U/L (ref 15–41)
Albumin: 4.2 g/dL (ref 3.5–5.0)
Alkaline Phosphatase: 43 U/L (ref 38–126)
Anion gap: 6 (ref 5–15)
BUN: 26 mg/dL — ABNORMAL HIGH (ref 6–20)
CO2: 28 mmol/L (ref 22–32)
Calcium: 9.2 mg/dL (ref 8.9–10.3)
Chloride: 105 mmol/L (ref 98–111)
Creatinine, Ser: 0.8 mg/dL (ref 0.44–1.00)
GFR, Estimated: >60 mL/min (ref >60)
Glucose, Bld: 89 mg/dL (ref 70–99)
Potassium: 3.8 mmol/L (ref 3.5–5.1)
Sodium: 139 mmol/L (ref 135–145)
Total Bilirubin: 0.4 mg/dL (ref 0.3–1.2)
Total Protein: 6.9 g/dL (ref 6.5–8.1)

## 2022-09-14 MED ORDER — KETOROLAC TROMETHAMINE 30 MG/ML IJ SOLN
30.0000 mg | Freq: Once | INTRAMUSCULAR | Status: AC
  Administered 2022-09-14: 30 mg via INTRAVENOUS
  Filled 2022-09-14: qty 1

## 2022-09-14 MED ORDER — IOHEXOL 300 MG/ML  SOLN
100.0000 mL | Freq: Once | INTRAMUSCULAR | Status: AC | PRN
  Administered 2022-09-14: 80 mL via INTRAVENOUS

## 2022-09-14 MED ORDER — GABAPENTIN 100 MG PO CAPS: 100.0000 mg | ORAL_CAPSULE | Freq: Three times a day (TID) | ORAL | 0 refills | Status: AC

## 2022-09-14 NOTE — ED Triage Notes (Signed)
"  Female related area, pressure, urgency"  Reports "throbbing in clitoris area" Uncomfortable getting more intense,  Seen by PCP for same Started about 1 week ago

## 2022-09-14 NOTE — ED Provider Notes (Signed)
[facility] EMERGENCY DEPT  Provider Note  CSN: [id number] Arrival date & time: 09/14/22 2248  History Chief Complaint  Patient presents with   Vaginal Pain    Monique Stephens is a 56 y.o. female who is s/p partial hysterectomy reports about a week of throbbing pain in her genital area, with some pelvic pressure, increasing in intensity for the last sevral days. Not associated with fever, vomiting, dysuria, hematuria, vaginal discharge, diarrhea, constipation or rectal pain. She denies any genital rashes. Not sexually active due to dyspareunia. She went to PCP where UA was clear. Scheduled to see Gyn next week but pain was too intense to wait. She has been taking naprosyn without improvement.    Home Medications Prior to Admission medications   Medication Sig Start Date End Date Taking? Authorizing Provider  gabapentin (NEURONTIN) 100 MG capsule Take 1 capsule (100 mg total) by mouth 3 (three) times daily. 09/14/22 10/14/22 Yes Pollyann Savoy, MD  fluticasone (FLONASE) 50 MCG/ACT nasal spray Place 2 sprays into both nostrils daily. 04/10/19   Corum, Minerva Fester, MD  loratadine (CLARITIN) 10 MG tablet Take 1 tablet (10 mg total) by mouth daily. Needs appointment- annual exam due 04/10/19   Wandra Feinstein, MD  valACYclovir (VALTREX) 1000 MG tablet TAKE TWO TABLETS BY MOUTH TWICE DAILY FOR 2 DOSES PRN 04/10/19   Wandra Feinstein, MD     Allergies    Atropine, Caffeine, and Morphine and related   Review of Systems   Review of Systems Please see HPI for pertinent positives and negatives  Physical Exam BP (!) 149/90   Pulse 91   Temp 98.1 F (36.7 C) (Oral)   Resp 17   SpO2 99%   Physical Exam Vitals and nursing note reviewed.  Constitutional:      Appearance: Normal appearance.  HENT:     Head: Normocephalic and atraumatic.     Nose: Nose normal.     Mouth/Throat:     Mouth: Mucous membranes are moist.  Eyes:     Extraocular Movements: Extraocular movements  intact.     Conjunctiva/sclera: Conjunctivae normal.  Cardiovascular:     Rate and Rhythm: Normal rate.  Pulmonary:     Effort: Pulmonary effort is normal.     Breath sounds: Normal breath sounds.  Abdominal:     General: Abdomen is flat.     Palpations: Abdomen is soft.     Tenderness: There is no abdominal tenderness.  Genitourinary:    Comments: Chaperon present. Normal external genitalia, no rashes. The area of her discomfort is not tender to palpation. No discharge or bleeding Musculoskeletal:        General: No swelling. Normal range of motion.     Cervical back: Neck supple.  Skin:    General: Skin is warm and dry.  Neurological:     General: No focal deficit present.     Mental Status: She is alert.  Psychiatric:        Mood and Affect: Mood normal.     ED Results / Procedures / Treatments   EKG None  Procedures Procedures  Medications Ordered in the ED Medications  ketorolac (TORADOL) 30 MG/ML injection 30 mg (30 mg Intravenous Given 09/14/22 0535)  iohexol (OMNIPAQUE) 300 MG/ML solution 100 mL (80 mLs Intravenous Contrast Given 09/14/22 0548)    Initial Impression and Plan  Patient here with persistent pain in genital area. No urinary or GI symptoms. She has had hysterectomy. Consider referred pain  from a pelvic process, less likely renal colic. Will check labs and send for CT to rule out acute structural process. Pain medication for comfort.   ED Course   Clinical Course as of 09/14/22 0639  Thu Sep 14, 2022  0507 CBC is normal.  [CS]  9485 CMP is normal.  [CS]  [phone] I personally viewed the images from radiology studies and agree with radiologist interpretation: CT is neg for acute process. Pain may be neurogenic in etiology. Will trial a course of gabapentin pending her Gyn follow up scheduled for next week.  [CS]    Clinical Course User Index [CS] Truddie Hidden, MD     MDM Rules/Calculators/A&P Medical Decision Making Problems  Addressed: Vaginal pain: acute illness or injury  Amount and/or Complexity of Data Reviewed Labs: ordered. Decision-making details documented in ED Course. Radiology: ordered and independent interpretation performed. Decision-making details documented in ED Course.  Risk Prescription drug management.    Final Clinical Impression(s) / ED Diagnoses Final diagnoses:  Vaginal pain    Rx / DC Orders ED Discharge Orders          Ordered    gabapentin (NEURONTIN) 100 MG capsule  3 times daily        09/14/22 0350             Truddie Hidden, MD 09/14/22 [phone]
